# Patient Record
Sex: Female | Born: 1975 | Race: White | Hispanic: No | Smoking: Never smoker
Health system: Southern US, Community
[De-identification: ages and names within clinical notes are randomized; demographics above are authoritative.]

## PROBLEM LIST (undated history)

## (undated) DIAGNOSIS — T7840XA Allergy, unspecified, initial encounter: Secondary | ICD-10-CM

## (undated) DIAGNOSIS — F329 Major depressive disorder, single episode, unspecified: Secondary | ICD-10-CM

## (undated) DIAGNOSIS — F419 Anxiety disorder, unspecified: Secondary | ICD-10-CM

## (undated) DIAGNOSIS — E039 Hypothyroidism, unspecified: Secondary | ICD-10-CM

## (undated) DIAGNOSIS — E079 Disorder of thyroid, unspecified: Secondary | ICD-10-CM

## (undated) DIAGNOSIS — J45909 Unspecified asthma, uncomplicated: Secondary | ICD-10-CM

## (undated) DIAGNOSIS — G4730 Sleep apnea, unspecified: Secondary | ICD-10-CM

## (undated) DIAGNOSIS — I499 Cardiac arrhythmia, unspecified: Secondary | ICD-10-CM

## (undated) DIAGNOSIS — K219 Gastro-esophageal reflux disease without esophagitis: Secondary | ICD-10-CM

## (undated) DIAGNOSIS — F32A Depression, unspecified: Secondary | ICD-10-CM

## (undated) HISTORY — DX: Disorder of thyroid, unspecified: E07.9

## (undated) HISTORY — DX: Anxiety disorder, unspecified: F41.9

## (undated) HISTORY — DX: Depression, unspecified: F32.A

## (undated) HISTORY — DX: Unspecified asthma, uncomplicated: J45.909

## (undated) HISTORY — DX: Allergy, unspecified, initial encounter: T78.40XA

## (undated) HISTORY — DX: Major depressive disorder, single episode, unspecified: F32.9

## (undated) MED FILL — Ferumoxytol Inj 510 MG/17ML (30 MG/ML) (Elemental Fe): INTRAVENOUS | Qty: 17 | Status: AC

---

## 2007-08-30 DIAGNOSIS — I499 Cardiac arrhythmia, unspecified: Secondary | ICD-10-CM

## 2007-08-30 HISTORY — DX: Cardiac arrhythmia, unspecified: I49.9

## 2007-08-30 HISTORY — PX: CHOLECYSTECTOMY: SHX55

## 2016-01-13 ENCOUNTER — Emergency Department
Admission: EM | Admit: 2016-01-13 | Discharge: 2016-01-13 | Disposition: A | Discharge status: Home / Self Care | Payer: BLUE CROSS/BLUE SHIELD | Attending: Emergency Medicine | Admitting: Emergency Medicine

## 2016-01-13 ENCOUNTER — Encounter: Payer: Self-pay | Admitting: Emergency Medicine

## 2016-01-13 ENCOUNTER — Emergency Department: Payer: BLUE CROSS/BLUE SHIELD

## 2016-01-13 DIAGNOSIS — R079 Chest pain, unspecified: Secondary | ICD-10-CM | POA: Insufficient documentation

## 2016-01-13 DIAGNOSIS — R0602 Shortness of breath: Secondary | ICD-10-CM | POA: Diagnosis present

## 2016-01-13 DIAGNOSIS — J40 Bronchitis, not specified as acute or chronic: Secondary | ICD-10-CM | POA: Insufficient documentation

## 2016-01-13 HISTORY — DX: Gastro-esophageal reflux disease without esophagitis: K21.9

## 2016-01-13 HISTORY — DX: Cardiac arrhythmia, unspecified: I49.9

## 2016-01-13 LAB — CBC
HCT: 35.6 % (ref 35.0–47.0)
Hemoglobin: 12.1 g/dL (ref 12.0–16.0)
MCH: 27.7 pg (ref 26.0–34.0)
MCHC: 33.8 g/dL (ref 32.0–36.0)
MCV: 81.8 fL (ref 80.0–100.0)
PLATELETS: 365 10*3/uL (ref 150–440)
RBC: 4.36 MIL/uL (ref 3.80–5.20)
RDW: 14.2 % (ref 11.5–14.5)
WBC: 6.2 10*3/uL (ref 3.6–11.0)

## 2016-01-13 LAB — BASIC METABOLIC PANEL
Anion gap: 7 (ref 5–15)
BUN: 7 mg/dL (ref 6–20)
CHLORIDE: 106 mmol/L (ref 101–111)
CO2: 25 mmol/L (ref 22–32)
CREATININE: 0.96 mg/dL (ref 0.44–1.00)
Calcium: 8.2 mg/dL — ABNORMAL LOW (ref 8.9–10.3)
GFR calc Af Amer: >60 mL/min (ref >60)
GFR calc non Af Amer: >60 mL/min (ref >60)
Glucose, Bld: 83 mg/dL (ref 65–99)
Potassium: 3.2 mmol/L — ABNORMAL LOW (ref 3.5–5.1)
SODIUM: 138 mmol/L (ref 135–145)

## 2016-01-13 LAB — TROPONIN I: Troponin I: <0.03 ng/mL (ref <0.031)

## 2016-01-13 MED ORDER — DEXTROSE 5 % IV SOLN
500.0000 mg | Freq: Once | INTRAVENOUS | Status: DC
  Filled 2016-01-13: qty 500

## 2016-01-13 MED ORDER — AZITHROMYCIN 250 MG PO TABS
ORAL_TABLET | ORAL | Status: AC
  Administered 2016-01-13: 500 mg via ORAL
  Filled 2016-01-13: qty 2

## 2016-01-13 MED ORDER — PREDNISONE 20 MG PO TABS
60.0000 mg | ORAL_TABLET | Freq: Once | ORAL | Status: AC
  Administered 2016-01-13: 60 mg via ORAL
  Filled 2016-01-13: qty 3

## 2016-01-13 MED ORDER — BENZONATATE 100 MG PO CAPS: 100.0000 mg | ORAL_CAPSULE | Freq: Three times a day (TID) | ORAL | Status: DC | PRN

## 2016-01-13 MED ORDER — AZITHROMYCIN 250 MG PO TABS
500.0000 mg | ORAL_TABLET | Freq: Once | ORAL | Status: AC
  Administered 2016-01-13: 500 mg via ORAL

## 2016-01-13 MED ORDER — AZITHROMYCIN 250 MG PO TABS
ORAL_TABLET | ORAL | Status: DC
Start: 2016-01-13 — End: 2016-12-02

## 2016-01-13 MED ORDER — PREDNISONE 20 MG PO TABS: 60.0000 mg | ORAL_TABLET | Freq: Every day | ORAL | Status: DC

## 2016-01-13 NOTE — ED Provider Notes (Signed)
[facility] Emergency Department Provider Note  ____________________________________________  Time seen: Approximately 9:05 PM  I have reviewed the triage vital signs and the nursing notes.   HISTORY  Chief Complaint Chest Pain and Shortness of Breath    HPI Linda Bell is a 40 y.o. female , nonsmoker, presenting with cough and shortness of breath. The patient reports that for the past several days she has had a nonproductive cough associated with congestion but no rhinorrhea. No ear pain or sore throat. When she coughed she occasionally has chest pain. No known sick contacts. Has tried her albuterol inhaler which has not seemed to help. No lower extremity swelling or calf pain.   Past Medical History  Diagnosis Date  . Arrhythmia   . GERD (gastroesophageal reflux disease)     There are no active problems to display for this patient.   Past Surgical History  Procedure Laterality Date  . Cholecystectomy      Current Outpatient Rx  Name  Route  Sig  Dispense  Refill  . azithromycin (ZITHROMAX) 250 MG tablet      1 tab daily by mouth for four days.   4 each   0   . benzonatate (TESSALON PERLES) 100 MG capsule   Oral   Take 1 capsule (100 mg total) by mouth 3 (three) times daily as needed for cough.   15 capsule   0   . predniSONE (DELTASONE) 20 MG tablet   Oral   Take 3 tablets (60 mg total) by mouth daily.   4 tablet   0     Allergies Review of patient's allergies indicates no known allergies.  No family history on file.  Social History Social History  Substance Use Topics  . Smoking status: Never Smoker   . Smokeless tobacco: None  . Alcohol Use: No    Review of Systems Constitutional: No fever/chills.No lightheadedness or syncope. Eyes: No visual changes. No eye discharge. ENT: No sore throat. Positive congestion without rhinorrhea. Cardiovascular: Positive chest pain. Denies palpitations. Respiratory: Positive  shortness of breath.  Has a nonproductive cough. Gastrointestinal: No abdominal pain.  No nausea, no vomiting.  No diarrhea.  No constipation. Genitourinary: Negative for dysuria. Musculoskeletal: Negative for back pain. Skin: Negative for rash. Neurological: Negative for headaches. No focal numbness, tingling or weakness.   10-point ROS otherwise negative.  ____________________________________________   PHYSICAL EXAM:  VITAL SIGNS: ED Triage Vitals  Enc Vitals Group     BP 01/13/16 1823 112/78 mmHg     Pulse Rate 01/13/16 1823 75     Resp 01/13/16 1823 20     Temp 01/13/16 1823 97.5 F (36.4 C)     Temp Source 01/13/16 1823 Oral     SpO2 01/13/16 1823 100 %     Weight 01/13/16 1823 330 lb (149.687 kg)     Height 01/13/16 1823  (1.727 m)     Head Cir --      Peak Flow --      Pain Score 01/13/16 1824 6     Pain Loc --      Pain Edu? --      Excl. in GC? --     Constitutional: Alert and oriented. Well appearing and in no acute distress. Answers questions appropriately.Intermittently has a dry cough. Eyes: Conjunctivae are normal.  EOMI. No scleral icterus. Head: Atraumatic. Nose: No congestion/rhinnorhea. Mouth/Throat: Mucous membranes are moist.  Neck: No stridor.  Supple.   Cardiovascular: Normal rate, regular  rhythm. No murmurs, rubs or gallops.  Respiratory: Normal respiratory effort.  No accessory muscle use or retractions. Lungs CTAB.  No wheezes, rales or ronchi. Gastrointestinal: Soft, nontender and nondistended.  No guarding or rebound.  No peritoneal signs. Musculoskeletal: No LE edema. No ttp in the calves or palpable cords.  Negative Homan's sign. Neurologic:  A&Ox3.  Speech is clear.  Face and smile are symmetric.  EOMI.  Moves all extremities well. Skin:  Skin is warm, dry and intact. No rash noted. Psychiatric: Mood and affect are normal. Speech and behavior are normal.  Normal judgement.  ____________________________________________   LABS (all  labs ordered are listed, but only abnormal results are displayed)  Labs Reviewed  BASIC METABOLIC PANEL - Abnormal; Notable for the following:    Potassium 3.2 (*)    Calcium 8.2 (*)    All other components within normal limits  CBC  TROPONIN I   ____________________________________________  EKG  ED ECG REPORT I, Rockne MenghiniNorman, Anne-Caroline, the attending physician, personally viewed and interpreted this ECG.   Date: 01/13/2016  EKG Time: 1809  Rate: 70  Rhythm: normal sinus rhythm  Axis: Leftward  Intervals:Borderline prolonged QTC  ST&T Change: No ST elevation.  ____________________________________________  RADIOLOGY  Dg Chest 2 View  01/13/2016  CLINICAL DATA:  Chest pain with shortness of breath and cough for a couple days. Patient denies fever, nausea and vomiting. EXAM: CHEST  2 VIEW COMPARISON:  None. FINDINGS: The heart size and mediastinal contours are normal. There is mild central airway thickening without edema, confluent airspace opacity, pleural effusion or pneumothorax. The bones appear unremarkable. IMPRESSION: Central airway thickening suggesting bronchitis. No evidence of pneumonia or edema. Electronically Signed   By: Carey BullocksWilliam  Veazey M.D.   On: 01/13/2016 18:49    ____________________________________________   PROCEDURES  Procedure(s) performed: None  Critical Care performed: No ____________________________________________   INITIAL IMPRESSION / ASSESSMENT AND PLAN / ED COURSE  Pertinent labs & imaging results that were available during my care of the patient were reviewed by me and considered in my medical decision making (see chart for details).  40 y.o. female who is nonsmoker presenting with several days of nonproductive cough, who is a chest x-ray which is consistent with bronchitis. I do not think this patient is a primary cardiac cause of her cough, nor do I see any evidence of pneumonia, arrhythmia or ischemic changes, or PE today. We'll plan to  treat her for bronchitis, and have her follow-up with her primary care physician. She understands return precautions as well as follow-up instructions.  ____________________________________________  FINAL CLINICAL IMPRESSION(S) / ED DIAGNOSES  Final diagnoses:  Bronchitis      NEW MEDICATIONS STARTED DURING THIS VISIT:  New Prescriptions   AZITHROMYCIN (ZITHROMAX) 250 MG TABLET    1 tab daily by mouth for four days.   BENZONATATE (TESSALON PERLES) 100 MG CAPSULE    Take 1 capsule (100 mg total) by mouth 3 (three) times daily as needed for cough.   PREDNISONE (DELTASONE) 20 MG TABLET    Take 3 tablets (60 mg total) by mouth daily.     Rockne MenghiniAnne-Caroline Vonda Harth, MD 01/13/16 2113

## 2016-01-13 NOTE — Discharge Instructions (Signed)
Please return to the emergency department if you develop shortness of breath, lightheadedness or fainting, fever, or any other symptoms concerning to you.

## 2016-01-13 NOTE — ED Notes (Signed)
Pt presents to ED with reports of chest pain, shortness of breath and cough for a couple of days. Pt states pain worsened today. Pt denies fever. Pt denies nausea, vomiting and diarrhea. Pt reports dizziness and lightheadedness. Pt speaking in complete sentences without difficulty.

## 2016-01-18 MED ORDER — LIDOCAINE HCL (PF) 1 % IJ SOLN
INTRAMUSCULAR | Status: AC
  Filled 2016-01-18: qty 5

## 2016-12-02 ENCOUNTER — Encounter: Payer: Self-pay | Admitting: Nurse Practitioner

## 2016-12-02 ENCOUNTER — Other Ambulatory Visit: Payer: Self-pay | Admitting: *Deleted

## 2016-12-02 ENCOUNTER — Ambulatory Visit (INDEPENDENT_AMBULATORY_CARE_PROVIDER_SITE_OTHER): Payer: BLUE CROSS/BLUE SHIELD | Admitting: Nurse Practitioner

## 2016-12-02 VITALS — BP 114/72 | HR 62 | Temp 97.5°F | Resp 16 | Ht 68.0 in | Wt 349.0 lb

## 2016-12-02 DIAGNOSIS — I499 Cardiac arrhythmia, unspecified: Secondary | ICD-10-CM | POA: Insufficient documentation

## 2016-12-02 DIAGNOSIS — E039 Hypothyroidism, unspecified: Secondary | ICD-10-CM | POA: Insufficient documentation

## 2016-12-02 DIAGNOSIS — F32A Depression, unspecified: Secondary | ICD-10-CM | POA: Insufficient documentation

## 2016-12-02 DIAGNOSIS — Z6841 Body Mass Index (BMI) 40.0 and over, adult: Secondary | ICD-10-CM | POA: Insufficient documentation

## 2016-12-02 DIAGNOSIS — E038 Other specified hypothyroidism: Secondary | ICD-10-CM

## 2016-12-02 DIAGNOSIS — Z7689 Persons encountering health services in other specified circumstances: Secondary | ICD-10-CM

## 2016-12-02 DIAGNOSIS — K589 Irritable bowel syndrome without diarrhea: Secondary | ICD-10-CM | POA: Insufficient documentation

## 2016-12-02 DIAGNOSIS — F329 Major depressive disorder, single episode, unspecified: Secondary | ICD-10-CM | POA: Insufficient documentation

## 2016-12-02 DIAGNOSIS — N926 Irregular menstruation, unspecified: Secondary | ICD-10-CM | POA: Diagnosis not present

## 2016-12-02 DIAGNOSIS — K582 Mixed irritable bowel syndrome: Secondary | ICD-10-CM

## 2016-12-02 DIAGNOSIS — J302 Other seasonal allergic rhinitis: Secondary | ICD-10-CM | POA: Insufficient documentation

## 2016-12-02 DIAGNOSIS — F419 Anxiety disorder, unspecified: Secondary | ICD-10-CM | POA: Insufficient documentation

## 2016-12-02 HISTORY — DX: Irritable bowel syndrome, unspecified: K58.9

## 2016-12-02 LAB — CBC WITH DIFFERENTIAL/PLATELET
Basophils Absolute: 0 cells/uL (ref 0–200)
Basophils Relative: 0 %
Eosinophils Absolute: 504 cells/uL — ABNORMAL HIGH (ref 15–500)
Eosinophils Relative: 6 %
HCT: 37.9 % (ref 35.0–45.0)
Hemoglobin: 12.2 g/dL (ref 11.7–15.5)
Lymphocytes Relative: 29 %
Lymphs Abs: 2436 cells/uL (ref 850–3900)
MCH: 27.5 pg (ref 27.0–33.0)
MCHC: 32.2 g/dL (ref 32.0–36.0)
MCV: 85.4 fL (ref 80.0–100.0)
MPV: 10 fL (ref 7.5–12.5)
Monocytes Absolute: 504 cells/uL (ref 200–950)
Monocytes Relative: 6 %
Neutro Abs: 4956 cells/uL (ref 1500–7800)
Neutrophils Relative %: 59 %
Platelets: 437 10*3/uL — ABNORMAL HIGH (ref 140–400)
RBC: 4.44 MIL/uL (ref 3.80–5.10)
RDW: 14.3 % (ref 11.0–15.0)
WBC: 8.4 10*3/uL (ref 3.8–10.8)

## 2016-12-02 LAB — TSH: TSH: 8.09 mIU/L — ABNORMAL HIGH

## 2016-12-02 NOTE — Patient Instructions (Addendum)
Linda Bell, Thank you for coming in to clinic today.  1. Weight Management: Keep a food log for the next 4-6 weeks.  Write down the meal time, what you are eating, and how much.  Include all snacks and drinks.  2. Thyroid:  We will check your TSH and we will consider thyroid hormone replacement after labs.  3. Irregular Menses:   Track your bleeding.  We will check CBC and hormone level of testosterone to check for PCOS.  Your vaginal exam today was normal.  We will see what your lab work shows and move forward from there.  We may need to refer you to OBGYN for further evaluation.   Please schedule a follow-up appointment with Wilhelmina Mcardle, AGNP in  1 months.  If you have any other questions or concerns, please feel free to call the clinic or send a message through MyChart. You may also schedule an earlier appointment if necessary.  Wilhelmina Mcardle, DNP, AGNP-BC Adult Gerontology Nurse Practitioner [facility] [place]   Polycystic Ovarian Syndrome Polycystic ovarian syndrome (PCOS) is a common hormonal disorder among women of reproductive age. In most women with PCOS, many small fluid-filled sacs (cysts) grow on the ovaries, and the cysts are not part of a normal menstrual cycle. PCOS can cause problems with your menstrual periods and make it difficult to get pregnant. It can also cause an increased risk of miscarriage with pregnancy. If it is not treated, PCOS can lead to serious health problems, such as diabetes and heart disease. What are the causes? The cause of PCOS is not known, but it may be the result of a combination of certain factors, such as:  Irregular menstrual cycle.  High levels of certain hormones (androgens).  Problems with the hormone that helps to control blood sugar (insulin resistance).  Certain genes. What increases the risk? This condition is more likely to develop in women who have a family history of PCOS. What are the signs or  symptoms? Symptoms of PCOS may include:  Multiple ovarian cysts.  Infrequent periods or no periods.  Periods that are too frequent or too heavy.  Unpredictable periods.  Inability to get pregnant (infertility) because of not ovulating.  Increased growth of hair on the face, chest, stomach, back, thumbs, thighs, or toes.  Acne or oily skin. Acne may develop during adulthood, and it may not respond to treatment.  Pelvic pain.  Weight gain or obesity.  Patches of thickened and dark brown or black skin on the neck, arms, breasts, or thighs (acanthosis nigricans).  Excess hair growth on the face, chest, abdomen, or upper thighs (hirsutism). How is this diagnosed? This condition is diagnosed based on:  Your medical history.  A physical exam, including a pelvic exam. Your health care provider may look for areas of increased hair growth on your skin.  Tests, such as:  Ultrasound. This may be used to examine the ovaries and the lining of the uterus (endometrium) for cysts.  Blood tests. These may be used to check levels of sugar (glucose), female hormone (testosterone), and female hormones (estrogen and progesterone) in your blood. How is this treated? There is no cure for PCOS, but treatment can help to manage symptoms and prevent more health problems from developing. Treatment varies depending on:  Your symptoms.  Whether you want to have a baby or whether you need birth control (contraception). Treatment may include nutrition and lifestyle changes along with:  Progesterone hormone to start a menstrual period.  Birth control pills to help you have regular menstrual periods.  Medicines to make you ovulate, if you want to get pregnant.  Medicine to reduce excessive hair growth.  Surgery, in severe cases. This may involve making small holes in one or both of your ovaries. This decreases the amount of testosterone that your body produces. Follow these instructions at  home:  Take over-the-counter and prescription medicines only as told by your health care provider.  Follow a healthy meal plan. This can help you reduce the effects of PCOS.  Eat a healthy diet that includes lean proteins, complex carbohydrates, fresh fruits and vegetables, low-fat dairy products, and healthy fats. Make sure to eat enough fiber.  If you are overweight, lose weight as told by your health care provider.  Losing 10% of your body weight may improve symptoms.  Your health care provider can determine how much weight loss is best for you and can help you lose weight safely.  Keep all follow-up visits as told by your health care provider. This is important. Contact a health care provider if:  Your symptoms do not get better with medicine.  You develop new symptoms. This information is not intended to replace advice given to you by your health care provider. Make sure you discuss any questions you have with your health care provider. Document Released: 12/09/2004 Document Revised: 04/12/2016 Document Reviewed: 01/31/2016 Elsevier Interactive Patient Education  2017 [employer].

## 2016-12-02 NOTE — Progress Notes (Signed)
Subjective:    Patient ID: Linda Bell, female    DOB: 1975/12/14, 41 y.o.   MRN: [id number]  Linda Bell is a 41 y.o. female presenting on 12/02/2016 for Establish Care   HPI   Last PCP [facility] in [place].  Taneasha states she needs a complete physical because her last one was about 2 years ago.  Her last PAP was November 2016 with normal cells.  She is unsure of HPV testing with the PAP.  Irregular menstrual bleeding Patient notes bleeding off and on for last 3 weeks with mostly spotting.  She was bleeding enough to have clots 2 days ago then spotting the next.  She thinks her last true period started 2 weeks ago.  She notes irregular menses for last 20 years.  After last pregnancy, she used depo x 1 year and hasn't had regular periods since she got pregnant.  Her last OBGYN visit was around the time of her last pregnancy.  Hypothyroidism Thyroid low in past.  Previously on 25 mcg Synthroid, but has not been taking for 2.5 years and she wants a TSH to see if she needs to start taking it again.  She has been having fatigue, and some weight gain (a few pounds in a year). No recent changes in skin, hair, nails, but notes her nails are always brittle and she has clumps of hair falling out.  She has a heart arrhythmia, but no palpitations recently on medication.  Denies sleep disruption, night sweats, leg swelling, and temperature intolerance.  IBS 19 years ago IBS - mostly requires straining and time for BM. Not constipation.  1 week: 3/7 days.  And extremes.  Not taking anything for this.  Used metamucil in beginning and didn't notice a difference.  Symptoms greatly improved from initial diagnosis and notes that it is manageable now.  BMI 48.6 At the largest weight.  Last 9 years has been around 340 - 350 lbs.  She is able to lose a few lbs, but regains.  She has not had any significant increases in weight over just a few months in her past.  She was overweight  as a child.    Currently she is only eating one meal per day.  She eats supper around 5-6 pm and has a nighttime snack at 10-11 pm with a few chips.  A regular size chip bag lasts about 2 weeks.  She notes her husband has recently lost a significant amount of weight intentionally and she is preparing the same foods for him at home as for herself.  She has no regular physical activity because she has no energy.  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  Past Medical History:  Diagnosis Date  . Allergy   . Anxiety   . Arrhythmia   . Depression   . GERD (gastroesophageal reflux disease)   . Thyroid disease    hypo   Past Surgical History:  Procedure Laterality Date  . CHOLECYSTECTOMY  2009   Social History   Social History  . Marital status: Married    Spouse name: N/A  . Number of children: N/A  . Years of education: N/A   Occupational History  . Not on file.   Social History Main Topics  . Smoking status: Never Smoker  . Smokeless tobacco: Never Used     Comment: second hand smoke exposure in home as child,  and adult none last 15 years  . Alcohol  use No  . Drug use: No  . Sexual activity: Yes    Birth control/ protection: None   Other Topics Concern  . Not on file   Social History Narrative  . No narrative on file   Family History  Problem Relation Age of Onset  . Cancer Mother     basal cell (> 1 occurence)  . Depression Mother   . Cancer Father     renal  . Drug abuse Father   . Mental illness Father   . Depression Father   . Mental illness Brother     substance abuse  . Heart attack Maternal Grandmother   . Prostate cancer Maternal Grandfather   . Depression Maternal Grandfather   . Mental illness Maternal Grandfather   . Depression Paternal Grandmother   . Heart attack Paternal Grandfather    No current outpatient prescriptions on file prior to visit.   No current facility-administered medications on file prior to  visit.     Review of Systems  Constitutional: Positive for fatigue. Negative for activity change, appetite change, chills and unexpected weight change.  HENT: Negative.   Eyes: Negative.   Respiratory: Negative.   Gastrointestinal: Positive for constipation and diarrhea. Negative for abdominal distention, abdominal pain, nausea and vomiting.  Endocrine: Negative.   Genitourinary: Positive for menstrual problem and vaginal bleeding.  Musculoskeletal: Positive for back pain and neck pain.  Skin: Negative.   Allergic/Immunologic: Negative.   Neurological: Negative.   Hematological: Negative.   Psychiatric/Behavioral: Negative.    Per HPI unless specifically indicated above  Depression screen [facility] 2/9 12/02/2016  Decreased Interest 0  Down, Depressed, Hopeless 0  PHQ - 2 Score 0       Objective:    BP 114/72 (BP Location: Right Arm, Patient Position: Sitting, Cuff Size: Large)   Pulse 62   Temp 97.5 F (36.4 C) (Oral)   Resp 16   Ht  (1.727 m)   Wt (!) 349 lb (158.3 kg)   BMI 53.07 kg/m   Wt Readings from Last 3 Encounters:  12/02/16 (!) 349 lb (158.3 kg)  01/13/16 (!) 330 lb (149.7 kg)    Physical Exam  Constitutional: She appears well-developed. No distress.  Morbidly obese  HENT:  Head: Normocephalic and atraumatic.  Right Ear: External ear normal.  Left Ear: External ear normal.  Mouth/Throat: Oropharynx is clear and moist.  Upper dentures  Eyes: Conjunctivae and EOM are normal. Pupils are equal, round, and reactive to light.  Neck: Normal range of motion. Neck supple. No JVD present. No tracheal deviation present. No thyromegaly present.  Cardiovascular: Normal rate, regular rhythm, normal heart sounds and intact distal pulses.   Pulmonary/Chest: Effort normal and breath sounds normal.  Abdominal: Soft. Bowel sounds are normal.  Negative hepatosplenomegaly with scratch test  Genitourinary: Vagina normal and uterus normal. No vaginal discharge found.    Genitourinary Comments: Difficult bimanual exam r/t body habitus  Lymphadenopathy:    She has no cervical adenopathy.  Neurological: She is alert.  Skin: Skin is warm and dry.  Psychiatric: Her behavior is normal.  Normal mood, flat affect   Results for orders placed or performed during the hospital encounter of 01/13/16  Basic metabolic panel  Result Value Ref Range   Sodium 138 135 - 145 mmol/L   Potassium 3.2 (L) 3.5 - 5.1 mmol/L   Chloride 106 101 - 111 mmol/L   CO2 25 22 - 32 mmol/L   Glucose, Bld 83 65 -  99 mg/dL   BUN 7 6 - 20 mg/dL   Creatinine, Ser 0.96 0.44 - 1.00 mg/dL9.14  Calcium 8.2 (L) 8.9 - 10.3 mg/dL   GFR calc non Af Amer >60 >60 mL/min   GFR calc Af Amer >60 >60 mL/min   Anion gap 7 5 - 15  CBC  Result Value Ref Range   WBC 6.2 3.6 - 11.0 K/uL   RBC 4.36 3.80 - 5.20 MIL/uL   Hemoglobin 12.1 12.0 - 16.0 g/dL   HCT 78.2 95.6 - 21.3 %   MCV 81.8 80.0 - 100.0 fL   MCH 27.7 26.0 - 34.0 pg   MCHC 33.8 32.0 - 36.0 g/dL   RDW 08.6 57.8 - 46.9 %   Platelets 365 150 - 440 K/uL  Troponin I  Result Value Ref Range   Troponin I <0.03 <0.031 ng/mL      Assessment & Plan:   Problem List Items Addressed This Visit      Digestive   IBS (irritable bowel syndrome) 1. Discussed adding metamucil or psyllium fiber back into regular use for encouragement of bowel consistency.   2. Patient experience with symptoms not significant for additional treatment at this time.   Relevant Medications   omeprazole (PRILOSEC) 40 MG capsule   famotidine (PEPCID) 40 MG tablet     Endocrine   Hypothyroid 1. History of previously treated hypothyroidism without active treatment. 2. Patient willing to resume treatment if indicated by labs. 3. Collect TSH for evaluation.   Relevant Orders   TSH     Other   BMI 45.0-49.9, adult (HCC) 1. General dietary and activity survey completed. Requested pt keep a food diary for review at next visit. 2. Educated patient that there may be  medically treatable conditions that contribute to obesity. 3. Screen for PCOS with Serum Total and free testosterone in presence of obesity and history of irregular menses.  4. Information about PCOS provided to patient for review.  Ensured patient she does not yet have a PCOS diagnosis, but that the information will be helpful for review and includes some tips for healthy lifestyle.    Other Visit Diagnoses    Establishing care with new doctor, encounter for    -  Primary   Irregular menses     1. Anatomical exam normal.  However, was difficult to perform secondary to body habitus.  Consider pelvic US and/or referral to OBGYN after results of labs received. 2. Rule out pregnancy.  Pt not currently preventing pregnancy. 3. Discussed options of OCP to regulate menses.  Patient not willing to consider this option at this time. 4. Obtain CBC to evaluate possible anemia in presence of long-term bleeding. 5. Screen PCOS with serum total and free testosterone. 6. Obtain TSH. Assess for possible contributing factors.   Relevant Orders   CBC with Differential TSH   Testosterone, free and Total   B-HCG Quant      Meds ordered this encounter  Medications  . omeprazole (PRILOSEC) 40 MG capsule    Sig: Take 40 mg by mouth daily.  . famotidine (PEPCID) 40 MG tablet    Sig: Take 40 mg by mouth daily.    Request records from former PCP at [facility] in [place].  Follow up plan: Return in about 1 month (around 01/01/2017) for annual physical.  Wilhelmina Mcardle, DNP, AGPCNP-BC Adult Gerontology Primary Care Nurse Practitioner [facility] 12/02/2016, 12:34 PM

## 2016-12-02 NOTE — Assessment & Plan Note (Signed)
1. Keep a food log for the next 4-6 weeks .   2. Write down the meal time, what you are eating, and how much.  Include all snacks and drinks.

## 2016-12-03 DIAGNOSIS — N926 Irregular menstruation, unspecified: Secondary | ICD-10-CM | POA: Insufficient documentation

## 2016-12-03 LAB — HCG, QUANTITATIVE, PREGNANCY: hCG, Beta Chain, Quant, S: <2 m[IU]/mL

## 2016-12-04 LAB — TESTOS,TOTAL,FREE AND SHBG (FEMALE)
Sex Hormone Binding Glob.: 33 nmol/L (ref 17–124)
Testosterone, Free: 3 pg/mL (ref 0.1–6.4)
Testosterone,Total,LC/MS/MS: 26 ng/dL (ref 2–45)

## 2016-12-05 ENCOUNTER — Ambulatory Visit (INDEPENDENT_AMBULATORY_CARE_PROVIDER_SITE_OTHER): Payer: BLUE CROSS/BLUE SHIELD | Admitting: Nurse Practitioner

## 2016-12-05 ENCOUNTER — Encounter: Payer: Self-pay | Admitting: Nurse Practitioner

## 2016-12-05 VITALS — BP 137/82 | HR 66 | Temp 97.7°F | Resp 16 | Ht 68.0 in | Wt 347.0 lb

## 2016-12-05 DIAGNOSIS — M542 Cervicalgia: Secondary | ICD-10-CM | POA: Diagnosis not present

## 2016-12-05 DIAGNOSIS — E039 Hypothyroidism, unspecified: Secondary | ICD-10-CM

## 2016-12-05 MED ORDER — METHOCARBAMOL 750 MG PO TABS: 750.0000 mg | ORAL_TABLET | Freq: Four times a day (QID) | ORAL | 0 refills | Status: DC | PRN

## 2016-12-05 MED ORDER — LEVOTHYROXINE SODIUM 50 MCG PO TABS: 50.0000 ug | ORAL_TABLET | Freq: Every day | ORAL | 3 refills | Status: DC

## 2016-12-05 NOTE — Progress Notes (Signed)
I have reviewed this encounter including the documentation in this note and/or discussed this patient with the provider, Wilhelmina Mcardle, AGPCNP-BC. I am certifying that I agree with the content of this note as supervising physician.  Saralyn Pilar, DO [facility] 12/05/2016, 9:22 AM

## 2016-12-05 NOTE — Progress Notes (Signed)
Subjective:    Patient ID: Linda Bell, female    DOB: 1976-06-26, 41 y.o.   MRN: [id number]  Linda Bell is a 41 y.o. female presenting on 12/05/2016 for Neck Pain (onset couple of months getting worst )   HPI  Back Pain Low back pain when sit and stand back hurts and takes a while to subside.  This is tolerable and resolves once moving.  Neck Pain Patient has neck pain on left side that extends around her shoulders to right side.  Something is "digging in" to the left side and tensing muscles.  The only time it doesn't hurt is when she doesn't move.  It hurts worse when she drives.  Onset of pain was months ago and started when she was a passenger in the car, now it hurts with any activty.  Pain daily with movement.    She took one flexeril once and it helped along with ibuprofen 600 mg.  She denies burning and shooting pain.  Pain does not radiate.   Pain effect on daily life: Doing things around house is difficult (washing dishes/cleaning kitchen while standing and bent over is most difficult.  It is not impossible, but after 10-15 minutes she needs a break.     Hypothyroidism No new symptoms since visit on 4/6.  Reviewed labs from 4/6 and indicates hypothyroidism.  Social History  Substance Use Topics  . Smoking status: Never Smoker  . Smokeless tobacco: Never Used     Comment: second hand smoke exposure in home as child,  and adult none last 15 years  . Alcohol use No    Review of Systems Per HPI unless specifically indicated above     Objective:    BP 137/82 (BP Location: Left Arm, Patient Position: Sitting, Cuff Size: Large)   Pulse 66   Temp 97.7 F (36.5 C) (Oral)   Resp 16   Ht 5' 8"  (1.727 m)   Wt (!) 347 lb (157.4 kg)   BMI 52.76 kg/m   Wt Readings from Last 3 Encounters:  12/05/16 (!) 347 lb (157.4 kg)  12/02/16 (!) 349 lb (158.3 kg)  01/13/16 (!) 330 lb (149.7 kg)    Physical Exam  Constitutional: She is oriented to person, place, and  time. She appears well-developed and well-nourished. No distress.  HENT:  Head: Normocephalic and atraumatic.  Neck: Neck supple. Muscular tenderness present. No spinous process tenderness present. No neck rigidity. Decreased range of motion present. No edema and no erythema present.  Decreased lateral ROM when bending neck toward L and R shoulders.  Passive ROM limited by pain.  Muscular tenderness over L shoulder and trapezius.  No muscular tenderness over cervical spine.  Cardiovascular: Normal rate, regular rhythm, normal heart sounds and intact distal pulses.   Neurological: She is alert and oriented to person, place, and time.  Skin: Skin is warm and dry.  Psychiatric: She has a normal mood and affect. Her behavior is normal.   Results for orders placed or performed in visit on 12/02/16  TSH  Result Value Ref Range   TSH 8.09 (H) mIU/L  CBC with Differential  Result Value Ref Range   WBC 8.4 3.8 - 10.8 K/uL   RBC 4.44 3.80 - 5.10 MIL/uL   Hemoglobin 12.2 11.7 - 15.5 g/dL   HCT 37.9 35.0 - 45.0 %   MCV 85.4 80.0 - 100.0 fL   MCH 27.5 27.0 - 33.0 pg   MCHC 32.2 32.0 - 36.0 g/dL  RDW 14.3 11.0 - 15.0 %   Platelets 437 (H) 140 - 400 K/uL   MPV 10.0 7.5 - 12.5 fL   Neutro Abs 4,956 1,500 - 7,800 cells/uL   Lymphs Abs 2,436 850 - 3,900 cells/uL   Monocytes Absolute 504 200 - 950 cells/uL   Eosinophils Absolute 504 (H) 15 - 500 cells/uL   Basophils Absolute 0 0 - 200 cells/uL   Neutrophils Relative % 59 %   Lymphocytes Relative 29 %   Monocytes Relative 6 %   Eosinophils Relative 6 %   Basophils Relative 0 %   Smear Review Criteria for review not met   hCG, quantitative, pregnancy  Result Value Ref Range   hCG, Beta Chain, Quant, S <2.0 mIU/mL  Testos,Total,Free and SHBG (Female)  Result Value Ref Range   Testosterone,Total,LC/MS/MS 26 2 - 45 ng/dL   Testosterone, Free 3.0 0.1 - 6.4 pg/mL   Sex Hormone Binding Glob. 33 17 - 124 nmol/L      Assessment & Plan:   Problem  List Items Addressed This Visit      Endocrine   Hypothyroid - Primary - START taking levothyroxine 50 mcg daily 1 hour before breakfast on an empty stomach.  Only drink water in the first hour. - Recheck TSH in 6 weeks as part of labs only visit.      Relevant Medications   levothyroxine (SYNTHROID, LEVOTHROID) 50 MCG tablet   Other Relevant Orders   TSH    Other Visit Diagnoses    Neck pain     - Referral and orders given for [facility] for eval and treat.  Recommended 2-3 times per week for 2-4 weeks of treatments with ongoing exercises at home. - Discussed that pain may increase as muscle tension is released.  Encouraged to keep working with PT/OT and at home exercises for long-term pain control. - Take Robaxin 1.5 grams four times per day as needed for 5 days. Do not use this if driving. - Start taking Tylenol extra strength 1 to 2 tablets every 6-8 hours for aches or fever/chills for next few days as needed.  Do not take more than 3,000 mg in 24 hours from all medicines.  May take Ibuprofen as well if tolerated 200-492m every 8 hours as needed. - Use heat and ice for pain.  Apply for 15 minutes at a time 6-8 times per day.   - Muscle rub with lidocaine.  Avoid using this with heat and ice to avoid burns.   Relevant Medications   methocarbamol (ROBAXIN-750) 750 MG tablet   Other Relevant Orders   Ambulatory referral to Physical Therapy      Meds ordered this encounter  Medications  . methocarbamol (ROBAXIN-750) 750 MG tablet    Sig: Take 1 tablet (750 mg total) by mouth 4 (four) times daily as needed for muscle spasms.    Dispense:  40 tablet    Refill:  0    Order Specific Question:   Supervising Provider    Answer:   KOlin Hauser[[id number]]  . levothyroxine (SYNTHROID, LEVOTHROID) 50 MCG tablet    Sig: Take 1 tablet (50 mcg total) by mouth daily.    Dispense:  90 tablet    Refill:  3    Order Specific Question:   Supervising Provider    Answer:    KOlin Hauser[[id number]]   Follow up plan: Return in about 3 months (around 03/06/2017) for 3 months for weight and depression;  labs only 6 weeks.  Cassell Smiles, DNP, AGPCNP-BC Adult Gerontology Primary Care Nurse Practitioner [facility] 12/05/2016, 10:37 PM

## 2016-12-05 NOTE — Patient Instructions (Addendum)
Linda Bell, Thank you for coming in to clinic today.  1. For your neck pain, our treatment will focus on resolving the issue causing your pain.  In this situation it is your muscle tension. - Please go to physical therapy for an evaluation and treatment.  I would recommend at least 2-3 weeks of treatments with ongoing work at home. - Take Robaxin 1.5 grams four times per day as needed. Do not use this if you need to drive, it can make you sleepy.  You have enough medication for 5 days.  - Start taking Tylenol extra strength 1 to 2 tablets every 6-8 hours for aches or fever/chills for next few days as needed.  Do not take more than 3,000 mg in 24 hours from all medicines.  May take Ibuprofen as well if tolerated 200-400mg  every 8 hours as needed. - Use heat and ice.  Apply this for 15 minutes at a time 6-8 times per day.   - Muscle rub with lidocaine.  Avoid using this with heat and ice to avoid burns.  2. For your thyroid. - START taking levothyroxine 50 mcg daily 1 hour before breakfast on an empty stomach.  Only drink water.      Please schedule a follow-up appointment with Wilhelmina Mcardle, AGNP in 3 months and 6 weeks for labs only for your thyroid.  If you have any other questions or concerns, please feel free to call the clinic or send a message through MyChart. You may also schedule an earlier appointment if necessary.  Wilhelmina Mcardle, DNP, AGNP-BC Adult Gerontology Nurse Practitioner [facility], [facility]   Levothyroxine tablets What is this medicine? LEVOTHYROXINE (lee voe thye ROX een) is a thyroid hormone. This medicine can improve symptoms of thyroid deficiency such as slow speech, lack of energy, weight gain, hair loss, dry skin, and feeling cold. It also helps to treat goiter (an enlarged thyroid gland). It is also used to treat some kinds of thyroid cancer along with surgery and other medicines. This medicine may be used for other purposes; ask your health care  provider or pharmacist if you have questions. COMMON BRAND NAME(S): Estre, Levo-T, Levothroid, Levoxyl, Synthroid, Thyro-Tabs, Unithroid What should I tell my health care provider before I take this medicine? They need to know if you have any of these conditions: -angina -blood clotting problems -diabetes -dieting or on a weight loss program -fertility problems -heart disease -high levels of thyroid hormone -pituitary gland problem -previous heart attack -an unusual or allergic reaction to levothyroxine, thyroid hormones, other medicines, foods, dyes, or preservatives -pregnant or trying to get pregnant -breast-feeding How should I use this medicine? Take this medicine by mouth with plenty of water. It is best to take on an empty stomach, at least 30 minutes before or 2 hours after food. Follow the directions on the prescription label. Take at the same time each day. Do not take your medicine more often than directed. Contact your pediatrician regarding the use of this medicine in children. While this drug may be prescribed for children and infants as young as a few days of age for selected conditions, precautions do apply. For infants, you may crush the tablet and place in a small amount of (5-10 ml or 1 to 2 teaspoonfuls) of water, breast milk, or non-soy based infant formula. Do not mix with soy-based infant formula. Give as directed. Overdosage: If you think you have taken too much of this medicine contact a poison control center or emergency  room at once. NOTE: This medicine is only for you. Do not share this medicine with others. What if I miss a dose? If you miss a dose, take it as soon as you can. If it is almost time for your next dose, take only that dose. Do not take double or extra doses. What may interact with this medicine? -amiodarone -antacids -anti-thyroid medicines -calcium supplements -carbamazepine -cholestyramine -colestipol -digoxin -female hormones, including  contraceptive or birth control pills -iron supplements -ketamine -liquid nutrition products like Ensure -medicines for colds and breathing difficulties -medicines for diabetes -medicines for mental depression -medicines or herbals used to decrease weight or appetite -phenobarbital or other barbiturate medications -phenytoin -prednisone or other corticosteroids -rifabutin -rifampin -soy isoflavones -sucralfate -theophylline -warfarin This list may not describe all possible interactions. Give your health care provider a list of all the medicines, herbs, non-prescription drugs, or dietary supplements you use. Also tell them if you smoke, drink alcohol, or use illegal drugs. Some items may interact with your medicine. What should I watch for while using this medicine? Be sure to take this medicine with plenty of fluids. Some tablets may cause choking, gagging, or difficulty swallowing from the tablet getting stuck in your throat. Most of these problems disappear if the medicine is taken with the right amount of water or other fluids. Do not switch brands of this medicine unless your health care professional agrees with the change. Ask questions if you are uncertain. You will need regular exams and occasional blood tests to check the response to treatment. If you are receiving this medicine for an underactive thyroid, it may be several weeks before you notice an improvement. Check with your doctor or health care professional if your symptoms do not improve. It may be necessary for you to take this medicine for the rest of your life. Do not stop using this medicine unless your doctor or health care professional advises you to. This medicine can affect blood sugar levels. If you have diabetes, check your blood sugar as directed. You may lose some of your hair when you first start treatment. With time, this usually corrects itself. If you are going to have surgery, tell your doctor or health care  professional that you are taking this medicine. What side effects may I notice from receiving this medicine? Side effects that you should report to your doctor or health care professional as soon as possible: -allergic reactions like skin rash, itching or hives, swelling of the face, lips, or tongue -chest pain -excessive sweating or intolerance to heat -fast or irregular heartbeat -nervousness -skin rash or hives -swelling of ankles, feet, or legs -tremors Side effects that usually do not require medical attention (report to your doctor or health care professional if they continue or are bothersome): -changes in appetite -changes in menstrual periods -diarrhea -hair loss -headache -trouble sleeping -weight loss This list may not describe all possible side effects. Call your doctor for medical advice about side effects. You may report side effects to FDA at 1-800-FDA-1088. Where should I keep my medicine? Keep out of the reach of children. Store at room temperature between 15 and 30 degrees C (59 and 86 degrees F). Protect from light and moisture. Keep container tightly closed. Throw away any unused medicine after the expiration date. NOTE: This sheet is a summary. It may not cover all possible information. If you have questions about this medicine, talk to your doctor, pharmacist, or health care provider.  2018 Elsevier/Gold Standard (2008-11-21 14:28:07)

## 2016-12-06 ENCOUNTER — Encounter: Payer: Self-pay | Admitting: Nurse Practitioner

## 2016-12-06 NOTE — Progress Notes (Signed)
I have reviewed this encounter including the documentation in this note and/or discussed this patient with the provider, Wilhelmina Mcardle, AGPCNP-BC. I am certifying that I agree with the content of this note as supervising physician.  Saralyn Pilar, DO [facility] 12/06/2016, 7:12 PM

## 2017-01-03 ENCOUNTER — Other Ambulatory Visit: Payer: Self-pay | Admitting: Nurse Practitioner

## 2017-01-03 NOTE — Telephone Encounter (Signed)
Last ov 12/05/16 Last filled 11/22/16

## 2017-01-16 ENCOUNTER — Other Ambulatory Visit: Payer: BLUE CROSS/BLUE SHIELD

## 2017-02-06 ENCOUNTER — Other Ambulatory Visit: Payer: Self-pay

## 2017-02-07 ENCOUNTER — Other Ambulatory Visit: Payer: Self-pay | Admitting: Nurse Practitioner

## 2017-02-07 ENCOUNTER — Other Ambulatory Visit: Payer: Self-pay

## 2017-02-07 MED ORDER — FLUOXETINE HCL 40 MG PO CAPS: 40.0000 mg | ORAL_CAPSULE | Freq: Every day | ORAL | 0 refills | Status: DC

## 2017-03-07 ENCOUNTER — Ambulatory Visit (INDEPENDENT_AMBULATORY_CARE_PROVIDER_SITE_OTHER): Payer: BLUE CROSS/BLUE SHIELD | Admitting: Nurse Practitioner

## 2017-03-07 ENCOUNTER — Encounter: Payer: Self-pay | Admitting: Nurse Practitioner

## 2017-03-07 VITALS — BP 114/70 | HR 61 | Temp 97.4°F | Ht 68.0 in | Wt 343.0 lb

## 2017-03-07 DIAGNOSIS — F331 Major depressive disorder, recurrent, moderate: Secondary | ICD-10-CM

## 2017-03-07 DIAGNOSIS — F4320 Adjustment disorder, unspecified: Secondary | ICD-10-CM | POA: Diagnosis not present

## 2017-03-07 DIAGNOSIS — F4321 Adjustment disorder with depressed mood: Secondary | ICD-10-CM

## 2017-03-07 MED ORDER — BUSPIRONE HCL 5 MG PO TABS: 5.0000 mg | ORAL_TABLET | Freq: Two times a day (BID) | ORAL | 2 refills | Status: DC

## 2017-03-07 NOTE — Progress Notes (Signed)
Subjective:    Patient ID: Linda Bell, female    DOB: 1976/07/18, 41 y.o.   MRN: [id number]  Linda Bell is a 41 y.o. female presenting on 03/07/2017 for Depression   HPI Depression Pt's husband passed away approx 4 weeks ago.  Fluoxetine 40 mg once daily -higher after last visit. Has helped moods w/ higher dose. PHQ-9 = 15 today  No motivation for household chores.  Went x 2 weeks w/o clean clothes.  Notices that she is constantly nervous.  Can't go to sleep at night and wants to sleep all day.  Some days will sleep all day and overnight. Normal day: can't sleep at night.  Once asleep, then sleeps all day. Also has inconsistent appetite (low/high).  Mother and son are living w/ pt which was living environment prior to husband's death.  Pt states she would like support services for grieving, but doesn't know where to find these.  Depression screen [facility] 2/9 03/07/2017 12/02/2016  Decreased Interest 2 0  Down, Depressed, Hopeless 2 0  PHQ - 2 Score 4 0  Altered sleeping 3 -  Tired, decreased energy 3 -  Change in appetite 3 -  Feeling bad or failure about yourself  0 -  Trouble concentrating 2 -  Moving slowly or fidgety/restless 0 -  Suicidal thoughts 0 -  PHQ-9 Score 15 -       Social History  Substance Use Topics  . Smoking status: Never Smoker  . Smokeless tobacco: Never Used     Comment: second hand smoke exposure in home as child,  and adult none last 15 years  . Alcohol use No    Review of Systems Per HPI unless specifically indicated above     Objective:    BP 114/70 (BP Location: Right Arm, Patient Position: Sitting, Cuff Size: Large)   Pulse 61   Temp (!) 97.4 F (36.3 C) (Oral)   Ht _0  (1.727 m)   Wt (!) 343 lb (155.6 kg)   BMI 52.15 kg/m   Wt Readings from Last 3 Encounters:  03/07/17 (!) 343 lb (155.6 kg)  12/05/16 (!) 347 lb (157.4 kg)  12/02/16 (!) 349 lb (158.3 kg)    Physical Exam  Constitutional: She appears well-developed  and well-nourished. No distress.  Morbidly obese  Cardiovascular: Normal rate, regular rhythm and normal heart sounds.   Pulmonary/Chest: Breath sounds normal. No respiratory distress.  Skin: Skin is warm and dry.  Psychiatric: Her speech is normal. Judgment and thought content normal. She is withdrawn. Cognition and memory are normal. She exhibits a depressed mood. She expresses no suicidal ideation. She expresses no suicidal plans.  Tearful at times, but not predominant for visit.   Results for orders placed or performed in visit on 12/02/16  TSH  Result Value Ref Range   TSH 8.09 (H) mIU/L  CBC with Differential  Result Value Ref Range   WBC 8.4 3.8 - 10.8 K/uL   RBC 4.44 3.80 - 5.10 MIL/uL   Hemoglobin 12.2 11.7 - 15.5 g/dL   HCT 37.9 35.0 - 45.0 %   MCV 85.4 80.0 - 100.0 fL   MCH 27.5 27.0 - 33.0 pg   MCHC 32.2 32.0 - 36.0 g/dL   RDW 14.3 11.0 - 15.0 %   Platelets 437 (H) 140 - 400 K/uL   MPV 10.0 7.5 - 12.5 fL   Neutro Abs 4,956 1,500 - 7,800 cells/uL   Lymphs Abs 2,436 850 - 3,900 cells/uL   Monocytes  Absolute 504 200 - 950 cells/uL   Eosinophils Absolute 504 (H) 15 - 500 cells/uL   Basophils Absolute 0 0 - 200 cells/uL   Neutrophils Relative % 59 %   Lymphocytes Relative 29 %   Monocytes Relative 6 %   Eosinophils Relative 6 %   Basophils Relative 0 %   Smear Review Criteria for review not met   hCG, quantitative, pregnancy  Result Value Ref Range   hCG, Beta Chain, Quant, S <2.0 mIU/mL  Testos,Total,Free and SHBG (Female)  Result Value Ref Range   Testosterone,Total,LC/MS/MS 26 2 - 45 ng/dL   Testosterone, Free 3.0 0.1 - 6.4 pg/mL   Sex Hormone Binding Glob. 33 17 - 124 nmol/L      Assessment & Plan:   Problem List Items Addressed This Visit      Other   Depression - Primary    Recently worsened after initial improvement r/t situational loss of husband.  Depression course complicated by grief.  Pt likely would benefit from bereavement or other counseling.  Pt unemployed and previously housewife only.  Plan: 1. Provided self-referral counseling services. 2. Continue lexapro 40 mg once daily. 3. START buspirone 5 mg bid once daily for 1 week.  Increase to twice daily then continue at this dose.  If effective at lower dose, may take lower dose. 4. Reviewed importance of keeping a schedule and setting appointments to leave her home at least once per week. 5. Followup 6 weeks.         Relevant Medications   busPIRone (BUSPAR) 5 MG tablet   Grief    Initially complicated grief w/ severe anhedonia and excessive sleeping.  Improving some in recent weeks.  Pt's husband died approx 4 weeks ago.  Plan: 1. Monitor for complicated grief.  Provided information about this condition to pt. 2. Encouraged social and family support.  Consider counseling and grief therapy. 3. Follow up 6 weeks or sooner if needed.         Meds ordered this encounter  Medications  . busPIRone (BUSPAR) 5 MG tablet    Sig: Take 1 tablet (5 mg total) by mouth 2 (two) times daily.    Dispense:  60 tablet    Refill:  2    Order Specific Question:   Supervising Provider    Answer:   Olin Hauser [[id number]]      Follow up plan: Return in about 6 weeks (around 04/18/2017) for depression.  Cassell Smiles, DNP, AGPCNP-BC Adult Gerontology Primary Care Nurse Practitioner [facility] 03/10/2017, 10:17 PM

## 2017-03-07 NOTE — Patient Instructions (Addendum)
Judeth CornfieldStephanie, Thank you for coming in to clinic today.  1. For your depression: - Continue Lexapro 40 mg once daily - START buspar 5 mg once daily for 1 week.  Increase to twice daily for 1 week. If effective at lower dose, may take lower dose. If helpful, but not completely resolves your symptoms, call clinic for dose increase.  - Increase physical activity to 30 minutes most days of the week. - Schedule a volunteer opportunity at least 1 hour 3 days per week. - Ask the cancer center about a bereavement group or other support.  - PSYCHIATRY / THERAPY-COUSENLING Internal Referral - Let me know if this is where you want to go and I will refer. [facility] - [facility]([place] [facility]) Address: [address], [place], [place] [address] Hours: 8:30AM-5PM Phone: [phone]  Self Referral [facility](Behavioral Health) [facility] [address], [place], [place] [address] Phone: [phone]  [facility], available walk-in 9am-4pm M-F [address] [place], [place] [address] Hours: 9am - 4pm (M-F, walk in available) [phone]  ----------------------------------------------------------------- [facility] ONLY Self Referral: 1. [facility]   Address: [address], [place], [place] [address] Hours: Open today  9AM-7PM Phone: [phone]  2. Anell Barr[facility]yl [facility], [facility]  - [facility] Address: [address], [place], [place] [address] Phone: [phone]   Please schedule a follow-up appointment with Wilhelmina McardleLauren Winda Summerall, AGNP to Return in about 6 weeks (around 04/18/2017) for depression.  If you have any other questions or concerns, please feel free to call the clinic or send a message through MyChart. You may also schedule an earlier appointment if necessary.  Wilhelmina McardleLauren Allissa Albright, DNP, AGNP-BC Adult Gerontology Nurse Practitioner [facility], [facility]     Complicated  Grieving Grief is a normal response to the death of someone close to you. Feelings of fear, anger, and guilt can affect almost everyone who loses a loved one. It is also common to have symptoms of depression while you are grieving. These include problems with sleep, loss of appetite, and lack of energy. They may last for weeks or months after a loss. Complicated grief is different from normal grief or depression. Normal grieving involves sadness and feelings of loss, but these feelings are not constant. Complicated grief is a constant and severe type of grief. It interferes with your ability to function normally. It may last for several months to a year or longer. Complicated grief may require treatment from a mental health care provider. What are the causes? It is not known why some people continue to struggle with grief and others do not. You may be at higher risk for complicated grief if:  The death of your loved one was sudden or unexpected.  The death of your loved one was due to a violent event.  Your loved one committed suicide.  Your loved one was a child or a young person.  You were very close to or dependent on the loved one.  You have a history of depression.  What are the signs or symptoms? Signs and symptoms of complicated grief may include:  Feeling disbelief or numbness.  Being unable to enjoy good memories of your loved one.  Needing to avoid anything that reminds you of your loved one.  Being unable to stop thinking about the death.  Feeling intense anger or guilt.  Feeling alone and hopeless.  Feeling that your life  is meaningless and empty.  Losing the desire to live.  How is this diagnosed? Your health care provider may diagnose complicated grief if:  You have constant symptoms of grief for 6-12 months or longer.  Your symptoms are interfering with your ability to live your life.  Your health care provider may want you to see a mental health care  provider. Many symptoms of depression are similar to the symptoms of complicated grief. It is important to be evaluated for complicated grief along with other mental health conditions. How is this treated? Talk therapy with a mental health provider is the most common treatment for complicated grief. During therapy, you will learn healthy ways to cope with the loss of your loved one. In some cases, your mental health care provider may also recommend antidepressant medicines. Follow these instructions at home:  Take care of yourself. ? Eat regular meals and maintain a healthy diet. Eat plenty of fruits, vegetables, and whole grains. ? Try to get some exercise each day. ? Keep regular hours for sleep. Try to get at least 8 hours of sleep each night.  Do not use drugs or alcohol to ease your symptoms.  Take medicines only as directed by your health care provider.  Spend time with friends and loved ones.  Consider joining a grief (bereavement) support group to help you deal with your loss.  Keep all follow-up visits as directed by your health care provider. This is important. Contact a health care provider if:  Your symptoms keep you from functioning normally.  Your symptoms do not get better with treatment. Get help right away if:  You have serious thoughts of hurting yourself or someone else.  You have suicidal feelings. This information is not intended to replace advice given to you by your health care provider. Make sure you discuss any questions you have with your health care provider. Document Released: 08/15/2005 Document Revised: 01/21/2016 Document Reviewed: 01/23/2014 Elsevier Interactive Patient Education  [employer].

## 2017-03-10 DIAGNOSIS — F4321 Adjustment disorder with depressed mood: Secondary | ICD-10-CM | POA: Insufficient documentation

## 2017-03-10 NOTE — Progress Notes (Signed)
I have reviewed this encounter including the documentation in this note and/or discussed this patient with the provider, Wilhelmina McardleLauren Kennedy, AGPCNP-BC. I am certifying that I agree with the content of this note as supervising physician.  Saralyn PilarAlexander Melesio Madara, DO [facility] 03/10/2017, 11:46 PM

## 2017-03-10 NOTE — Assessment & Plan Note (Signed)
Initially complicated grief w/ severe anhedonia and excessive sleeping.  Improving some in recent weeks.  Pt's husband died approx 4 weeks ago.  Plan: 1. Monitor for complicated grief.  Provided information about this condition to pt. 2. Encouraged social and family support.  Consider counseling and grief therapy. 3. Follow up 6 weeks or sooner if needed.

## 2017-03-10 NOTE — Assessment & Plan Note (Addendum)
Recently worsened after initial improvement r/t situational loss of husband.  Depression course complicated by grief.  Pt likely would benefit from bereavement or other counseling. Pt unemployed and previously housewife only.  Plan: 1. Provided self-referral counseling services. 2. Continue lexapro 40 mg once daily. 3. START buspirone 5 mg bid once daily for 1 week.  Increase to twice daily then continue at this dose.  If effective at lower dose, may take lower dose. 4. Reviewed importance of keeping a schedule and setting appointments to leave her home at least once per week. 5. Followup 6 weeks.

## 2017-03-14 ENCOUNTER — Telehealth: Payer: Self-pay | Admitting: Nurse Practitioner

## 2017-03-14 NOTE — Telephone Encounter (Signed)
Pt had requested bereavement resources at last visit.   Can make a referral for [facility] for family bereavement after death if she would like.  Called and left a message for her to return my call.

## 2017-03-15 NOTE — Telephone Encounter (Signed)
I called and left a message on MJ from Grief Services at [facility]. I'm awaiting response from how to arrange the services.

## 2017-03-16 ENCOUNTER — Encounter: Payer: Self-pay | Admitting: Nurse Practitioner

## 2017-03-16 NOTE — Telephone Encounter (Signed)
I attempted to contact the pt no answer and unable to leave a message because the mailbox is full. I'm reaching out to the pt to give her the information for Grief Support and Counseling Services through [facility]. I spoke with MJ who handles the scheduling for grieving services and she informed me that all the pt needs to do is to call her at [phone] between the hours of 8-5pm Monday- Thursday and she will get her set -up.

## 2017-03-20 ENCOUNTER — Encounter: Payer: Self-pay | Admitting: Nurse Practitioner

## 2017-03-20 ENCOUNTER — Ambulatory Visit (INDEPENDENT_AMBULATORY_CARE_PROVIDER_SITE_OTHER): Payer: BLUE CROSS/BLUE SHIELD | Admitting: Nurse Practitioner

## 2017-03-20 VITALS — BP 105/62 | HR 58 | Temp 97.8°F | Ht 68.0 in | Wt 345.5 lb

## 2017-03-20 DIAGNOSIS — J40 Bronchitis, not specified as acute or chronic: Secondary | ICD-10-CM | POA: Diagnosis not present

## 2017-03-20 MED ORDER — ALBUTEROL SULFATE HFA 108 (90 BASE) MCG/ACT IN AERS: 1.0000 | INHALATION_SPRAY | Freq: Four times a day (QID) | RESPIRATORY_TRACT | 5 refills | Status: AC | PRN

## 2017-03-20 MED ORDER — BENZONATATE 100 MG PO CAPS: 100.0000 mg | ORAL_CAPSULE | Freq: Two times a day (BID) | ORAL | 0 refills | Status: DC | PRN

## 2017-03-20 NOTE — Progress Notes (Signed)
Subjective:    Patient ID: Linda Bell, female    DOB: October 30, 1975, 41 y.o.   MRN: [id number]  Linda Bell is a 41 y.o. female presenting on 03/20/2017 for Cough (sneezing, runny nose and post-nasal drainage x 4 days)   HPI  Cough/URI Pt presents w/ sore throat, sneezing, runny nose.  Then cough x about 4 days. No sinus pressure.  Feels chest congestion/pressure worse w/ coughing.  Cough is not productive. Denies fever, chills, sweats,  Nausea vomiting headaches.  Has taken Allegra once daily. Not regularly and has had some relief when taking.  Pt notes seasonal allergies, but has not been taking Allegra continuously since allergy season.   Social History  Substance Use Topics  . Smoking status: Never Smoker  . Smokeless tobacco: Never Used     Comment: second hand smoke exposure in home as child,  and adult none last 15 years  . Alcohol use No    Review of Systems Per HPI unless specifically indicated above     Objective:    BP 105/62 (BP Location: Right Arm, Patient Position: Sitting, Cuff Size: Large)   Pulse (!) 58   Temp 97.8 F (36.6 C) (Oral)   Ht 5' 8"  (1.727 m)   Wt (!) 345 lb 8 oz (156.7 kg)   SpO2 98%   BMI 52.53 kg/m   Wt Readings from Last 3 Encounters:  03/20/17 (!) 345 lb 8 oz (156.7 kg)  03/07/17 (!) 343 lb (155.6 kg)  12/05/16 (!) 347 lb (157.4 kg)    Physical Exam  Constitutional: She is oriented to person, place, and time. She appears well-developed and well-nourished. No distress.  HENT:  Head: Normocephalic and atraumatic.  Right Ear: External ear normal.  Left Ear: External ear normal.  Nose: Nose normal.  Mouth/Throat: Oropharynx is clear and moist.  Neck: Normal range of motion. Neck supple. No JVD present. No tracheal deviation present. No thyromegaly present.  Cardiovascular: Normal rate, regular rhythm, normal heart sounds and intact distal pulses.   Pulmonary/Chest: Effort normal. She has rales in the right upper field, the  right lower field, the left upper field and the left lower field.  Lymphadenopathy:    She has no cervical adenopathy.  Neurological: She is alert and oriented to person, place, and time.  Skin: Skin is warm and dry.  Psychiatric: She has a normal mood and affect. Her behavior is normal. Judgment and thought content normal.     Results for orders placed or performed in visit on 12/02/16  TSH  Result Value Ref Range   TSH 8.09 (H) mIU/L  CBC with Differential  Result Value Ref Range   WBC 8.4 3.8 - 10.8 K/uL   RBC 4.44 3.80 - 5.10 MIL/uL   Hemoglobin 12.2 11.7 - 15.5 g/dL   HCT 37.9 35.0 - 45.0 %   MCV 85.4 80.0 - 100.0 fL   MCH 27.5 27.0 - 33.0 pg   MCHC 32.2 32.0 - 36.0 g/dL   RDW 14.3 11.0 - 15.0 %   Platelets 437 (H) 140 - 400 K/uL   MPV 10.0 7.5 - 12.5 fL   Neutro Abs 4,956 1,500 - 7,800 cells/uL   Lymphs Abs 2,436 850 - 3,900 cells/uL   Monocytes Absolute 504 200 - 950 cells/uL   Eosinophils Absolute 504 (H) 15 - 500 cells/uL   Basophils Absolute 0 0 - 200 cells/uL   Neutrophils Relative % 59 %   Lymphocytes Relative 29 %   Monocytes Relative  6 %   Eosinophils Relative 6 %   Basophils Relative 0 %   Smear Review Criteria for review not met   hCG, quantitative, pregnancy  Result Value Ref Range   hCG, Beta Chain, Quant, S <2.0 mIU/mL  Testos,Total,Free and SHBG (Female)  Result Value Ref Range   Testosterone,Total,LC/MS/MS 26 2 - 45 ng/dL   Testosterone, Free 3.0 0.1 - 6.4 pg/mL   Sex Hormone Binding Glob. 33 17 - 124 nmol/L      Assessment & Plan:   Problem List Items Addressed This Visit    None    Visit Diagnoses    Bronchitis    -  Primary Cough and symptoms c/w bronchitis likely viral in nature complicated by allergies.  Plan: 1. Pt may use albuterol as needed for shortness of breath. 2. Take benzonatate 100 mg bid as needed for cough. 3. Follow up 5-7 days if not improving and will consider antibiotic.     Relevant Medications   albuterol  (PROVENTIL HFA;VENTOLIN HFA) 108 (90 Base) MCG/ACT inhaler   benzonatate (TESSALON) 100 MG capsule      Meds ordered this encounter  Medications  . albuterol (PROVENTIL HFA;VENTOLIN HFA) 108 (90 Base) MCG/ACT inhaler    Sig: Inhale 1-2 puffs into the lungs every 6 (six) hours as needed for wheezing or shortness of breath.    Dispense:  1 Inhaler    Refill:  5    Order Specific Question:   Supervising Provider    Answer:   Olin Hauser [[id number]]  . benzonatate (TESSALON) 100 MG capsule    Sig: Take 1 capsule (100 mg total) by mouth 2 (two) times daily as needed for cough.    Dispense:  20 capsule    Refill:  0    Order Specific Question:   Supervising Provider    Answer:   Olin Hauser [[id number]]      Follow up plan: Return 5-7 days if symptoms worsen or fail to improve.   Cassell Smiles, DNP, AGPCNP-BC Adult Gerontology Primary Care Nurse Practitioner [facility] 03/20/2017, 3:28 PM

## 2017-03-20 NOTE — Patient Instructions (Addendum)
Linda Bell, Thank you for coming in to clinic today.  1. You likely have a viral bronchitis. Call in 5-7 days if no improvement.  We will consider antibiotics.  - Take benzonatate 100 mg capsules twice daily as needed for cough. - Mucinex for congestion - Take allegra daily to prevent your allergy and mucous production. - albuterol: take 2 puffs every 6 hours for shortness of breath or difficulty breathing.   Please schedule a follow-up appointment with Wilhelmina McardleLauren Anjolie Majer, AGNP. Return 5-7 days if symptoms worsen or fail to improve.  If you have any other questions or concerns, please feel free to call the clinic or send a message through MyChart. You may also schedule an earlier appointment if necessary.  You will receive a survey after today's visit either digitally by e-mail or paper by [employer] mail. Your experiences and feedback matter to us.  Please respond so we know how we are doing as we provide care for you.   Wilhelmina McardleLauren Chang Tiggs, DNP, AGNP-BC Adult Gerontology Nurse Practitioner [facility], [facility]

## 2017-03-20 NOTE — Progress Notes (Signed)
I have reviewed this encounter including the documentation in this note and/or discussed this patient with the provider, Wilhelmina McardleLauren Kennedy, AGPCNP-BC. I am certifying that I agree with the content of this note as supervising physician.  Saralyn PilarAlexander Marketta Valadez, DO [facility] 03/20/2017, 5:27 PM

## 2017-04-06 ENCOUNTER — Encounter: Payer: Self-pay | Admitting: Nurse Practitioner

## 2017-04-06 ENCOUNTER — Ambulatory Visit (INDEPENDENT_AMBULATORY_CARE_PROVIDER_SITE_OTHER): Payer: BLUE CROSS/BLUE SHIELD | Admitting: Nurse Practitioner

## 2017-04-06 VITALS — BP 114/57 | HR 60 | Temp 98.2°F | Ht 68.0 in | Wt 344.0 lb

## 2017-04-06 DIAGNOSIS — G5631 Lesion of radial nerve, right upper limb: Secondary | ICD-10-CM

## 2017-04-06 MED ORDER — GABAPENTIN 100 MG PO CAPS: 100.0000 mg | ORAL_CAPSULE | Freq: Three times a day (TID) | ORAL | 0 refills | Status: DC | PRN

## 2017-04-06 NOTE — Progress Notes (Signed)
Subjective:    Patient ID: Linda Bell, female    DOB: 04-09-1976, 41 y.o.   MRN: [id number]  Linda Bell is a 41 y.o. female presenting on 04/06/2017 for Arm Pain (right arm pain. Pt reports the arm is swollen and throbbing. )   HPI Right Arm Pain Onset today when she woke up.  Notes that when she moves her arm she has a "grabbing pain" that is sharp and aching w/ arm extension.  Keeping it bent and limiting movement for now to prevent pain.   May have some muscle cramping. Also noted swelling Right arm > than Left arm.  Precipitating factors: Was lying in bed using her phone last night.  Keeps right arm on pillow and scrolls w/ right thumb.  Pt sleeps on left side most of night.  Short periods on right side only.  Also occasionally on stomach.  Has not taken any medication or tried any alleviating factors today.  Social History  Substance Use Topics  . Smoking status: Never Smoker  . Smokeless tobacco: Never Used     Comment: second hand smoke exposure in home as child,  and adult none last 15 years  . Alcohol use No    Review of Systems Per HPI unless specifically indicated above     Objective:    BP (!) 114/57 (BP Location: Left Arm, Patient Position: Sitting, Cuff Size: Large)   Pulse 60   Temp 98.2 F (36.8 C) (Oral)   Ht 5' 8"  (1.727 m)   Wt (!) 344 lb (156 kg)   BMI 52.31 kg/m   Wt Readings from Last 3 Encounters:  04/06/17 (!) 344 lb (156 kg)  03/20/17 (!) 345 lb 8 oz (156.7 kg)  03/07/17 (!) 343 lb (155.6 kg)    Physical Exam  Constitutional: She is oriented to person, place, and time. She appears well-developed and well-nourished. No distress.  Neck: Normal range of motion. Neck supple. No spinous process tenderness and no muscular tenderness present.  Cardiovascular: Normal rate, regular rhythm and normal heart sounds.   Pulmonary/Chest: Effort normal and breath sounds normal. No respiratory distress. She has no wheezes.  Musculoskeletal:   Right shoulder: She exhibits normal range of motion, no tenderness, no bony tenderness, no swelling, no pain and no spasm.       Right elbow: She exhibits decreased range of motion. She exhibits no swelling and no effusion. Tenderness found. Radial head tenderness noted. No medial epicondyle, no lateral epicondyle and no olecranon process tenderness noted.       Right wrist: Normal. She exhibits normal range of motion and no tenderness.       Right upper arm: She exhibits tenderness and swelling. She exhibits no bony tenderness, no deformity and no laceration.       Right forearm: Normal.       Arms:      Right hand: Normal. Normal sensation noted. Normal strength noted.  Neurological: She is alert and oriented to person, place, and time. No cranial nerve deficit.  Skin: Skin is warm and dry.  Psychiatric: She has a normal mood and affect. Her behavior is normal. Thought content normal.  Vitals reviewed.   Results for orders placed or performed in visit on 12/02/16  TSH  Result Value Ref Range   TSH 8.09 (H) mIU/L  CBC with Differential  Result Value Ref Range   WBC 8.4 3.8 - 10.8 K/uL   RBC 4.44 3.80 - 5.10 MIL/uL   Hemoglobin 12.2  11.7 - 15.5 g/dL   HCT 37.9 35.0 - 45.0 %   MCV 85.4 80.0 - 100.0 fL   MCH 27.5 27.0 - 33.0 pg   MCHC 32.2 32.0 - 36.0 g/dL   RDW 14.3 11.0 - 15.0 %   Platelets 437 (H) 140 - 400 K/uL   MPV 10.0 7.5 - 12.5 fL   Neutro Abs 4,956 1,500 - 7,800 cells/uL   Lymphs Abs 2,436 850 - 3,900 cells/uL   Monocytes Absolute 504 200 - 950 cells/uL   Eosinophils Absolute 504 (H) 15 - 500 cells/uL   Basophils Absolute 0 0 - 200 cells/uL   Neutrophils Relative % 59 %   Lymphocytes Relative 29 %   Monocytes Relative 6 %   Eosinophils Relative 6 %   Basophils Relative 0 %   Smear Review Criteria for review not met   hCG, quantitative, pregnancy  Result Value Ref Range   hCG, Beta Chain, Quant, S <2.0 mIU/mL  Testos,Total,Free and SHBG (Female)  Result Value Ref  Range   Testosterone,Total,LC/MS/MS 26 2 - 45 ng/dL   Testosterone, Free 3.0 0.1 - 6.4 pg/mL   Sex Hormone Binding Glob. 33 17 - 124 nmol/L      Assessment & Plan:   Problem List Items Addressed This Visit    None    Visit Diagnoses    Radial nerve irritation, right    -  Primary Pain likely self-limited.  Muscle strain, arm swelling, and nerve irritiation possible complicated by prolonged period of time in position w/ stress when using phone.  Plan:  1. Treat with NSAIDs (acetaminophen and ibuprofen).  Discussed alternate dosing and max dosing. 2. Apply heat and/or ice to affected area. 3. May also apply a muscle rub with lidocaine after heat or ice. 4. Take gabapentin up to three times daily.  Cautioned drowsiness. 5. Encouraged pt to keep elevated, continue movement as tolerated.  6. Follow up as needed in 5-7 days if no improvement.    Relevant Medications   gabapentin (NEURONTIN) 100 MG capsule      Meds ordered this encounter  Medications  . gabapentin (NEURONTIN) 100 MG capsule    Sig: Take 1 capsule (100 mg total) by mouth 3 (three) times daily as needed. OR 2-3 capsules at bedtime for nerve pain.    Dispense:  45 capsule    Refill:  0    Order Specific Question:   Supervising Provider    Answer:   Olin Hauser [[id number]]      Follow up plan: Return 5-7 days if symptoms worsen or fail to improve.   Cassell Smiles, DNP, AGPCNP-BC Adult Gerontology Primary Care Nurse Practitioner [facility] 04/07/2017, 12:55 PM

## 2017-04-06 NOTE — Patient Instructions (Addendum)
Judeth CornfieldStephanie, Thank you for coming in to clinic today.  1. You have nerve irritation and soft tissue swelling:  - Start taking Tylenol extra strength 1 to 2 tablets every 6-8 hours for aches or fever/chills for next few days as needed.  Do not take more than 3,000 mg in 24 hours from all medicines.  May take Ibuprofen as well if tolerated 200-400mg  every 8 hours as needed. - Use heat and ice.  Apply this for 15 minutes at a time 6-8 times per day.   - Muscle rub with lidocaine.  Avoid using this with heat and ice to avoid burns. - Take gabapentin 100 mg up to three times daily as needed for pain OR take 2-3 tablets at bedtime.  This medicine is likely to make you drowsy. - Elevate your arm on a pillow when resting.   Please schedule a follow-up appointment with Wilhelmina McardleLauren Sundi Slevin, AGNP. Return 5-7 days if symptoms worsen or fail to improve.  If you have any other questions or concerns, please feel free to call the clinic or send a message through MyChart. You may also schedule an earlier appointment if necessary.  You will receive a survey after today's visit either digitally by e-mail or paper by [employer] mail. Your experiences and feedback matter to us.  Please respond so we know how we are doing as we provide care for you.   Wilhelmina McardleLauren Jamey Demchak, DNP, AGNP-BC Adult Gerontology Nurse Practitioner [facility], [facility]

## 2017-04-07 NOTE — Progress Notes (Signed)
I have reviewed this encounter including the documentation in this note and/or discussed this patient with the provider, Wilhelmina McardleLauren Kennedy, AGPCNP-BC. I am certifying that I agree with the content of this note as supervising physician.  Saralyn PilarAlexander Laquasha Groome, DO [facility] 04/07/2017, 1:05 PM

## 2017-04-18 ENCOUNTER — Ambulatory Visit (INDEPENDENT_AMBULATORY_CARE_PROVIDER_SITE_OTHER): Payer: BLUE CROSS/BLUE SHIELD | Admitting: Nurse Practitioner

## 2017-04-18 ENCOUNTER — Telehealth: Payer: Self-pay | Admitting: Nurse Practitioner

## 2017-04-18 VITALS — BP 112/60 | HR 57 | Temp 98.7°F | Ht 68.0 in | Wt 343.5 lb

## 2017-04-18 DIAGNOSIS — F331 Major depressive disorder, recurrent, moderate: Secondary | ICD-10-CM | POA: Diagnosis not present

## 2017-04-18 DIAGNOSIS — F4321 Adjustment disorder with depressed mood: Secondary | ICD-10-CM

## 2017-04-18 DIAGNOSIS — E039 Hypothyroidism, unspecified: Secondary | ICD-10-CM

## 2017-04-18 DIAGNOSIS — F4320 Adjustment disorder, unspecified: Secondary | ICD-10-CM

## 2017-04-18 DIAGNOSIS — Z6841 Body Mass Index (BMI) 40.0 and over, adult: Secondary | ICD-10-CM

## 2017-04-18 DIAGNOSIS — F419 Anxiety disorder, unspecified: Secondary | ICD-10-CM | POA: Diagnosis not present

## 2017-04-18 MED ORDER — FLUOXETINE HCL 60 MG PO TABS: 60.0000 mg | ORAL_TABLET | Freq: Every day | ORAL | 2 refills | Status: DC

## 2017-04-18 MED ORDER — FLUOXETINE HCL 20 MG PO TABS: 60.0000 mg | ORAL_TABLET | Freq: Every day | ORAL | 3 refills | Status: DC

## 2017-04-18 MED ORDER — BUSPIRONE HCL 10 MG PO TABS: 10.0000 mg | ORAL_TABLET | Freq: Two times a day (BID) | ORAL | 2 refills | Status: DC

## 2017-04-18 NOTE — Patient Instructions (Addendum)
Linda Bell, Thank you for coming in to clinic today.  1. You will be due for FASTING BLOOD WORK (no food or drink after midnight before, only water or coffee without cream/sugar on the morning of) - Please go ahead and schedule a "Lab Only" visit in the morning at the clinic for lab draw in the next 2 days  For Lab Results, once available within 2-3 days of blood draw, you can can log in to MyChart online to view your results and a brief explanation. Also, we can discuss results at next follow-up visit.   2. For your depression: - INCREASE prozac to 60mg  once daily.  - INCREASE buspar to 10 mg twice daily.  Until you get your new medicine, take 2 tablets twice daily.   Please schedule a follow-up appointment with Wilhelmina Mcardle, AGNP. Return in about 2 months (around 06/18/2017) for depression or sooner if needed.  We can make medicine changes in 4 weeks if you need.  If you have any other questions or concerns, please feel free to call the clinic or send a message through MyChart. You may also schedule an earlier appointment if necessary.  You will receive a survey after today's visit either digitally by e-mail or paper by [employer]. Your experiences and feedback matter to [place].  Please respond so we know how we are doing as we provide care for you.   Wilhelmina Mcardle, DNP, AGNP-BC Adult Gerontology Nurse Practitioner [facility], [facility]

## 2017-04-18 NOTE — Telephone Encounter (Signed)
Joe at pharmacy said the fluoxetine 30 mg will cost pt $80 but if prescription is written for 20 mg capsules, take 3 once daily it will only cost $4.  Joe's call back number is [phone]

## 2017-04-18 NOTE — Progress Notes (Signed)
Subjective:    Patient ID: [place], female    DOB: 1976/08/08, 41 y.o.   MRN: [id number]  [place] is a 41 y.o. female presenting on 04/18/2017 for Depression (f/u visit )   HPI Depression, Anxiety, and Grief Has gotten into grief counseling.  Pt is currently taking Prozac 40 mg and Buspar 5 mg bid. No improvement per pt w/ buspar. - Remains anxious and jittery and "gets obsessed over things."  Rumination of thoughts. - Is having significantly difficult time focusing.  Can't go to grocery store and shop from list.  Distractions. - Daytime: gets up daily for cleaning, Now moving to make bedroom less about her husband's presence.  Does leave home a couple times per week. - Improved appetite.  Hypothyroidism Missing doses about 2-3x per week.  Has not had TSH recheck since last dose change > 6 weeks ago.  Pt states she is taking his levothyroxine in the am at least 1 hour before eating or drinking and taking other medicines.  She denies heart racing, heart palpitations, heat and cold intolerance, changes in hair/skin/nails, and lower leg swelling. She does not have any compressive symptoms to include difficulty swallowing, globus sensation, or difficulty breathing when lying flat.   Obesity Appetite is improving - less binge eating than initially after husband's death. Now stable weight.  Pt is not active and does not currently desire to start exercising.   Social History  Substance Use Topics  . Smoking status: Never Smoker  . Smokeless tobacco: Never Used     Comment: second hand smoke exposure in home as child,  and adult none last 15 years  . Alcohol use No    Review of Systems Per HPI unless specifically indicated above     Objective:    BP 112/60 (BP Location: Right Arm, Patient Position: Sitting, Cuff Size: Large)   Pulse (!) 57   Temp 98.7 F (37.1 C) (Oral)   Ht 5\' 8"  (1.727 m)   Wt (!) 343 lb 8 oz (155.8 kg)   SpO2 98%   BMI 52.23 kg/m   Wt  Readings from Last 3 Encounters:  04/18/17 (!) 343 lb 8 oz (155.8 kg)  04/06/17 (!) 344 lb (156 kg)  03/20/17 (!) 345 lb 8 oz (156.7 kg)    Physical Exam  General - morbidly obese, well-appearing, NAD HEENT - Normocephalic, atraumatic, PERRL, EOMI, patent nares w/o congestion, oropharynx clear, MMM Neck - supple, non-tender, no LAD, thyromegaly noted on palpation not visible to naked eye Heart - RRR, no murmurs heard Lungs - Clear throughout all lobes, no wheezing, crackles, or rhonchi. Normal work of breathing. Abdomen - soft, NTND, no masses, no hepatosplenomegaly, active bowel sounds Extremeties - non-tender, no edema, cap refill < 2 seconds, peripheral pulses intact +2 bilaterally Skin - warm, dry, no rashes Neuro - awake, alert, oriented x3, normal gait Psych - Depressed mood and affect, normal behavior.  Not withdrawn.  Results for orders placed or performed in visit on 04/18/17  TSH  Result Value Ref Range   TSH 5.88 (H) mIU/L  Lipid panel  Result Value Ref Range   Cholesterol 169 <200 mg/dL   Triglycerides 76 <031 mg/dL   HDL 45 (L) >59 mg/dL   Total CHOL/HDL Ratio 3.8 <5.0 Ratio   VLDL 15 <30 mg/dL   LDL Cholesterol 458 (H) <100 mg/dL      Assessment & Plan:   Problem List Items Addressed This Visit      Endocrine  Hypothyroid - Primary   Relevant Orders   TSH (Completed)   Lipid panel (Completed)     Other   Depression    Uncontrolled, but complicated by grief.  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  Established goal for self to try to resume work about 6 months after husbands death.  Plan: 1. Increase lexapro 60 mg once daily. 3. Increase buspirone 10 mg bid. 4. Reviewed importance of keeping a schedule and setting appointments to leave her home at least 2-3 times per week.  Pt beginning to accept this and is trying to find a way to connect to the community that she will enjoy. 5. Follow-up 6 weeks for med  management or sooner if needed.         Relevant Medications   busPIRone (BUSPAR) 10 MG tablet   Anxiety    See AP for depression      Relevant Medications   busPIRone (BUSPAR) 10 MG tablet   BMI 45.0-49.9, adult (HCC)    STABLE w/o weight gain in last 6 weeks. Pt w/o desire to focus on weight loss at this time.  Plan: 1. Discussed eating healthier food choices, continuing to avoid binge eating. 2. Follow up about 3 months.      Relevant Orders   TSH (Completed)   Lipid panel (Completed)   Grief    Initially complicated grief w/ severe anhedonia, excessive sleeping, binge eating.  Improving some in recent weeks.  Pt now connected w/ grief counselor.  Pt's husband died approx 3 months ago.  Plan: 1. Monitor for complicated grief.  Continue w/ grief counseling. 2. Encouraged social and family support.  3. Follow up 6 weeks or sooner if needed.         Meds ordered this encounter  Medications  . FLUoxetine HCl 20 MG TABS    Sig: Take 3 tablets (60 mg total) by mouth daily.    Dispense:  90 tablet    Refill:  2    Order Specific Question:   Supervising Provider    Answer:   Smitty Cords [[id number]]  . busPIRone (BUSPAR) 10 MG tablet    Sig: Take 1 tablet (10 mg total) by mouth 2 (two) times daily.    Dispense:  60 tablet    Refill:  2    Order Specific Question:   Supervising Provider    Answer:   Smitty Cords [[id number]]      Follow up plan: Return in about 2 months (around 06/18/2017) for depression or sooner if needed.  We can make medicine changes in 4 weeks if you need.   Linda Mcardle, DNP, AGPCNP-BC Adult Gerontology Primary Care Nurse Practitioner [facility] 04/21/2017, 10:27 AM

## 2017-04-19 ENCOUNTER — Other Ambulatory Visit: Payer: BLUE CROSS/BLUE SHIELD

## 2017-04-19 LAB — TSH: TSH: 5.88 mIU/L — ABNORMAL HIGH

## 2017-04-20 LAB — LIPID PANEL
Cholesterol: 169 mg/dL (ref <200)
HDL: 45 mg/dL — ABNORMAL LOW (ref >50)
LDL Cholesterol: 109 mg/dL — ABNORMAL HIGH (ref <100)
Total CHOL/HDL Ratio: 3.8 Ratio (ref <5.0)
Triglycerides: 76 mg/dL (ref <150)
VLDL: 15 mg/dL (ref <30)

## 2017-04-21 ENCOUNTER — Encounter: Payer: Self-pay | Admitting: Nurse Practitioner

## 2017-04-21 NOTE — Assessment & Plan Note (Signed)
STABLE w/o weight gain in last 6 weeks. Pt w/o desire to focus on weight loss at this time.  Plan: 1. Discussed eating healthier food choices, continuing to avoid binge eating. 2. Follow up about 3 months.

## 2017-04-21 NOTE — Assessment & Plan Note (Signed)
See A&P for depression

## 2017-04-21 NOTE — Progress Notes (Signed)
I have reviewed this encounter including the documentation in this note and/or discussed this patient with the provider, Lauren Kennedy, AGPCNP-BC. I am certifying that I agree with the content of this note as supervising physician.  Cleatis Fandrich, DO [facility] 04/21/2017, 2:45 PM 

## 2017-04-21 NOTE — Assessment & Plan Note (Signed)
Uncontrolled, but complicated by grief.  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  Established goal for self to try to resume work about 6 months after husbands death.  Plan: 1. Increase lexapro 60 mg once daily. 3. Increase buspirone 10 mg bid. 4. Reviewed importance of keeping a schedule and setting appointments to leave her home at least 2-3 times per week.  Pt beginning to accept this and is trying to find a way to connect to the community that she will enjoy. 5. Follow-up 6 weeks for med management or sooner if needed.

## 2017-04-21 NOTE — Assessment & Plan Note (Signed)
Initially complicated grief w/ severe anhedonia, excessive sleeping, binge eating.  Improving some in recent weeks.  Pt now connected w/ grief counselor.  Pt's husband died approx 3 months ago.  Plan: 1. Monitor for complicated grief.  Continue w/ grief counseling. 2. Encouraged social and family support.  3. Follow up 6 weeks or sooner if needed.

## 2017-06-04 ENCOUNTER — Other Ambulatory Visit: Payer: Self-pay | Admitting: Nurse Practitioner

## 2017-06-04 MED ORDER — FLUOXETINE HCL 20 MG PO TABS: 60.0000 mg | ORAL_TABLET | Freq: Every day | ORAL | 1 refills | Status: DC

## 2017-06-19 ENCOUNTER — Ambulatory Visit: Payer: BLUE CROSS/BLUE SHIELD | Admitting: Nurse Practitioner

## 2017-06-30 ENCOUNTER — Other Ambulatory Visit: Payer: Self-pay | Admitting: Nurse Practitioner

## 2017-07-03 ENCOUNTER — Other Ambulatory Visit: Payer: Self-pay

## 2017-07-03 MED ORDER — METOPROLOL SUCCINATE ER 50 MG PO TB24: 50.0000 mg | ORAL_TABLET | Freq: Every day | ORAL | 5 refills | Status: DC

## 2017-11-02 ENCOUNTER — Other Ambulatory Visit: Payer: Self-pay

## 2017-11-03 ENCOUNTER — Other Ambulatory Visit: Payer: Self-pay

## 2017-11-03 ENCOUNTER — Ambulatory Visit: Payer: Self-pay | Admitting: Nurse Practitioner

## 2017-11-03 ENCOUNTER — Encounter: Payer: Self-pay | Admitting: Nurse Practitioner

## 2017-11-03 VITALS — BP 106/70 | HR 64 | Temp 98.3°F | Ht 68.0 in | Wt 325.4 lb

## 2017-11-03 DIAGNOSIS — I498 Other specified cardiac arrhythmias: Secondary | ICD-10-CM

## 2017-11-03 DIAGNOSIS — F419 Anxiety disorder, unspecified: Secondary | ICD-10-CM

## 2017-11-03 DIAGNOSIS — F331 Major depressive disorder, recurrent, moderate: Secondary | ICD-10-CM

## 2017-11-03 DIAGNOSIS — F410 Panic disorder [episodic paroxysmal anxiety] without agoraphobia: Secondary | ICD-10-CM

## 2017-11-03 DIAGNOSIS — E039 Hypothyroidism, unspecified: Secondary | ICD-10-CM

## 2017-11-03 MED ORDER — FLUOXETINE HCL 20 MG PO CAPS: 60.0000 mg | ORAL_CAPSULE | Freq: Every day | ORAL | 5 refills | Status: DC

## 2017-11-03 MED ORDER — BUSPIRONE HCL 10 MG PO TABS: 15.0000 mg | ORAL_TABLET | Freq: Two times a day (BID) | ORAL | 2 refills | Status: DC

## 2017-11-03 MED ORDER — HYDROXYZINE HCL 25 MG PO TABS: 12.5000 mg | ORAL_TABLET | Freq: Three times a day (TID) | ORAL | 2 refills | Status: DC | PRN

## 2017-11-03 MED ORDER — METOPROLOL SUCCINATE ER 50 MG PO TB24: 50.0000 mg | ORAL_TABLET | Freq: Every day | ORAL | 12 refills | Status: DC

## 2017-11-03 NOTE — Patient Instructions (Signed)
Judeth CornfieldStephanie, Thank you for coming in to clinic today.  1. PSYCHIATRY / THERAPY-COUSENLING Self Referral [facility](Behavioral Health) [place] [address], [place], [place] [address] Phone: [phone]  [facility], available walk-in 9am-4pm M-F [address] [place], [place] [address] Hours: 9am - 4pm (M-F, walk in available) [phone]  [facility]   Address: [address], [place], [place] [address] Hours: Tues 4:15PM-8PM // Weds 9am - 12pm // Magdalene Mollyhurs 1pm - 8pm (Closed other days) Phone: [phone]  Please schedule a follow-up appointment with Wilhelmina McardleLauren Teonna Coonan, AGNP. No Follow-up on file.  If you have any other questions or concerns, please feel free to call the clinic or send a message through MyChart. You may also schedule an earlier appointment if necessary.  You will receive a survey after today's visit either digitally by e-mail or paper by [employer] mail. Your experiences and feedback matter to us.  Please respond so we know how we are doing as we provide care for you.   Wilhelmina McardleLauren Dmitri Pettigrew, DNP, AGNP-BC Adult Gerontology Nurse Practitioner [facility]

## 2017-11-03 NOTE — Progress Notes (Signed)
Subjective:    Patient ID: [place], female    DOB: Oct 10, 1975, 42 y.o.   MRN: [id number]  Linda HousemanStephanie Gertsch is a 42 y.o. female presenting on 11/03/2017 for Anxiety   HPI Anxiety and Depression Pt is beginning to cope with her husbands death in better manner, however she notes she is still avoiding coping and grieving. - Most significant to patient at this time is continuing to have some panic in public, shaking inside, "wants to jump out of [her] skin." - Pt is now staying with a best friend.  She is not living in her old home anymore.  Has been there since October.  Goes home on weekends an doccasionally during the week.  Best friend is disabled.  Now helping her is helping pt be more purposeful.  Admits the change was to initially avoid being at home.  Now, seems to be able to go home easier on weekends.  Heart Arrhythmia: Pt has had heart arrhythmia in past and is taking metoprolol daily for rate control.  Pt denes any heart racing or palpitations.  She is tolerating medication well without side effects.  Hypothyroidism - Pt states she is taking her levothyroxine 50mcg in the am at least 1 hour before eating or drinking and taking other medicines.   - She is not currently symptomatic. - She denies, fatigue, excess energy, weight changes, heart racing, heart palpitations, heat and cold intolerance, changes in hair/skin/nails, and lower leg swelling.  - She does not have any compressive symptoms to include difficulty swallowing, globus sensation, or difficulty breathing when lying flat.   Social History   Tobacco Use  . Smoking status: Never Smoker  . Smokeless tobacco: Never Used  . Tobacco comment: second hand smoke exposure in home as child,  and adult none last 15 years  Substance Use Topics  . Alcohol use: No  . Drug use: No    Review of Systems Per HPI unless specifically indicated above     Objective:    BP 106/70 (BP Location: Right Arm, Patient Position:  Sitting, Cuff Size: Large)   Pulse 64   Temp 98.3 F (36.8 C) (Oral)   Ht 5\' 8"  (1.727 m)   Wt (!) 325 lb 6.4 oz (147.6 kg)   BMI 49.48 kg/m   Wt Readings from Last 3 Encounters:  11/03/17 (!) 325 lb 6.4 oz (147.6 kg)  04/18/17 (!) 343 lb 8 oz (155.8 kg)  04/06/17 (!) 344 lb (156 kg)    Physical Exam  Constitutional: She is oriented to person, place, and time. She appears well-developed and well-nourished. No distress.  HENT:  Head: Normocephalic and atraumatic.  Cardiovascular: Normal rate, regular rhythm, S1 normal, S2 normal, normal heart sounds and intact distal pulses.  Pulmonary/Chest: Effort normal and breath sounds normal. No respiratory distress.  Neurological: She is alert and oriented to person, place, and time.  Skin: Skin is warm and dry.  Psychiatric: Her speech is normal and behavior is normal. Judgment and thought content normal. Cognition and memory are normal. She exhibits a depressed mood. She expresses no homicidal and no suicidal ideation.  Vitals reviewed.    Results for orders placed or performed in visit on 04/18/17  TSH  Result Value Ref Range   TSH 5.88 (H) mIU/L  Lipid panel  Result Value Ref Range   Cholesterol 169 <200 mg/dL   Triglycerides 76 <191<150 mg/dL   HDL 45 (L) >47>50 mg/dL   Total CHOL/HDL Ratio 3.8 <5.0 Ratio  VLDL 15 <30 mg/dL   LDL Cholesterol 161 (H) <100 mg/dL      Assessment & Plan:   Problem List Items Addressed This Visit      Cardiovascular and Mediastinum   Arrhythmia    Remains stable on medication.  She continues metoprolol 50 mg 24 hr tablet once daily without side effects, palpitations, or racing HR.  Continue medication without changes.      Relevant Medications   metoprolol succinate (TOPROL-XL) 50 MG 24 hr tablet     Endocrine   Hypothyroid    Stable at last lab evaluation.  No recheck, however, in last 6 months.  Will provide single refill, but pt will need lab collection to assess appropriateness of current  dose.  Refill provided.  TSH ordered.  Pt verbalizes understanding for need to recheck TSH. Followup once every 6 months.      Relevant Medications   metoprolol succinate (TOPROL-XL) 50 MG 24 hr tablet   Other Relevant Orders   TSH     Other   Depression - Primary    Slightly improved, but remains uncontrolled.  It is complicated by unresolved grief, however, pt had suboptimal control prior to her husband's death..  Pt remains unemployed and previously housewife only.   Plan: 1. Continue on fluoxetine 20 mg once daily. 2. Continue  buspirone 10 mg bid. 3. START hydroxyzine 25 mg up to tid for panic attack. 4. Reviewed importance of keeping a schedule and setting appointments.  Keep social connections.  Cautioned avoidance of family and her home as way of avoiding grief.  Continue plan for finding employment. 5. Follow-up 6-8 weeks for med management or sooner if needed.   Encouraged pt to obtain care with psychiatry for optimizing care with therapy/counseling.       Relevant Medications   FLUoxetine (PROZAC) 20 MG capsule   hydrOXYzine (ATARAX/VISTARIL) 25 MG tablet   busPIRone (BUSPAR) 10 MG tablet   Anxiety   Relevant Medications   FLUoxetine (PROZAC) 20 MG capsule   hydrOXYzine (ATARAX/VISTARIL) 25 MG tablet   busPIRone (BUSPAR) 10 MG tablet   Panic attack   Relevant Medications   FLUoxetine (PROZAC) 20 MG capsule   hydrOXYzine (ATARAX/VISTARIL) 25 MG tablet   busPIRone (BUSPAR) 10 MG tablet      Meds ordered this encounter  Medications  . FLUoxetine (PROZAC) 20 MG capsule    Sig: Take 3 capsules (60 mg total) by mouth daily.    Dispense:  90 capsule    Refill:  5    Order Specific Question:   Supervising Provider    Answer:   Smitty Cords [[id number]]  . hydrOXYzine (ATARAX/VISTARIL) 25 MG tablet    Sig: Take 0.5-1 tablets (12.5-25 mg total) by mouth 3 (three) times daily as needed for anxiety.    Dispense:  60 tablet    Refill:  2    May provide partial fill  if needed.    Order Specific Question:   Supervising Provider    Answer:   Smitty Cords [[id number]]  . busPIRone (BUSPAR) 10 MG tablet    Sig: Take 1.5 tablets (15 mg total) by mouth 2 (two) times daily.    Dispense:  90 tablet    Refill:  2    Order Specific Question:   Supervising Provider    Answer:   Smitty Cords [[id number]]  . metoprolol succinate (TOPROL-XL) 50 MG 24 hr tablet    Sig: Take 1 tablet (50 mg total) by  mouth daily. Take with or immediately following a meal.    Dispense:  30 tablet    Refill:  12    Order Specific Question:   Supervising Provider    Answer:   Smitty Cords [[id number]]    Follow up plan: Return in about 8 weeks (around 12/29/2017) for anxiety and depression.  Wilhelmina Mcardle, DNP, AGPCNP-BC Adult Gerontology Primary Care Nurse Practitioner [facility] 11/26/2017, 12:11 PM

## 2017-11-18 ENCOUNTER — Other Ambulatory Visit: Payer: Self-pay | Admitting: Nurse Practitioner

## 2017-11-18 DIAGNOSIS — E039 Hypothyroidism, unspecified: Secondary | ICD-10-CM

## 2017-11-18 NOTE — Telephone Encounter (Signed)
Must have labs prior to additional refills - Labs were ordered at last visit.  Please call pt to ensure she has this scheduled.

## 2017-11-20 ENCOUNTER — Telehealth: Payer: Self-pay

## 2017-11-20 NOTE — Telephone Encounter (Signed)
Must have labs prior to additional refills - Labs were ordered at last visit.  Please call pt to ensure she has this scheduled. Pt notified as per Leotis ShamesLauren for future refill for levothyroxine.

## 2017-11-26 ENCOUNTER — Encounter: Payer: Self-pay | Admitting: Nurse Practitioner

## 2017-11-26 DIAGNOSIS — F410 Panic disorder [episodic paroxysmal anxiety] without agoraphobia: Secondary | ICD-10-CM | POA: Insufficient documentation

## 2017-11-26 NOTE — Assessment & Plan Note (Signed)
Remains stable on medication.  She continues metoprolol 50 mg 24 hr tablet once daily without side effects, palpitations, or racing HR.  Continue medication without changes.

## 2017-11-26 NOTE — Assessment & Plan Note (Signed)
Stable at last lab evaluation.  No recheck, however, in last 6 months.  Will provide single refill, but pt will need lab collection to assess appropriateness of current dose.  Refill provided.  TSH ordered.  Pt verbalizes understanding for need to recheck TSH. Followup once every 6 months.

## 2017-11-26 NOTE — Assessment & Plan Note (Signed)
Slightly improved, but remains uncontrolled.  It is complicated by unresolved grief, however, pt had suboptimal control prior to her husband's death..  Pt remains unemployed and previously housewife only.   Plan: 1. Continue on fluoxetine 20 mg once daily. 2. Continue  buspirone 10 mg bid. 3. START hydroxyzine 25 mg up to tid for panic attack. 4. Reviewed importance of keeping a schedule and setting appointments.  Keep social connections.  Cautioned avoidance of family and her home as way of avoiding grief.  Continue plan for finding employment. 5. Follow-up 6 weeks for med management or sooner if needed.   Encouraged pt to obtain care with psychiatry for optimizing care with therapy/counseling.

## 2017-12-19 IMAGING — CR DG CHEST 2V
1 series · 2 of 2 positions shown · non-contrast
Comparison: None.

CLINICAL DATA: Chest pain with shortness of breath and cough for a
couple days. Patient denies fever, nausea and vomiting.

EXAM:
CHEST  2 VIEW

[Series 1: w chest pa · 0.14mm/px · 2 of 2 slices shown]
[im 1/2]
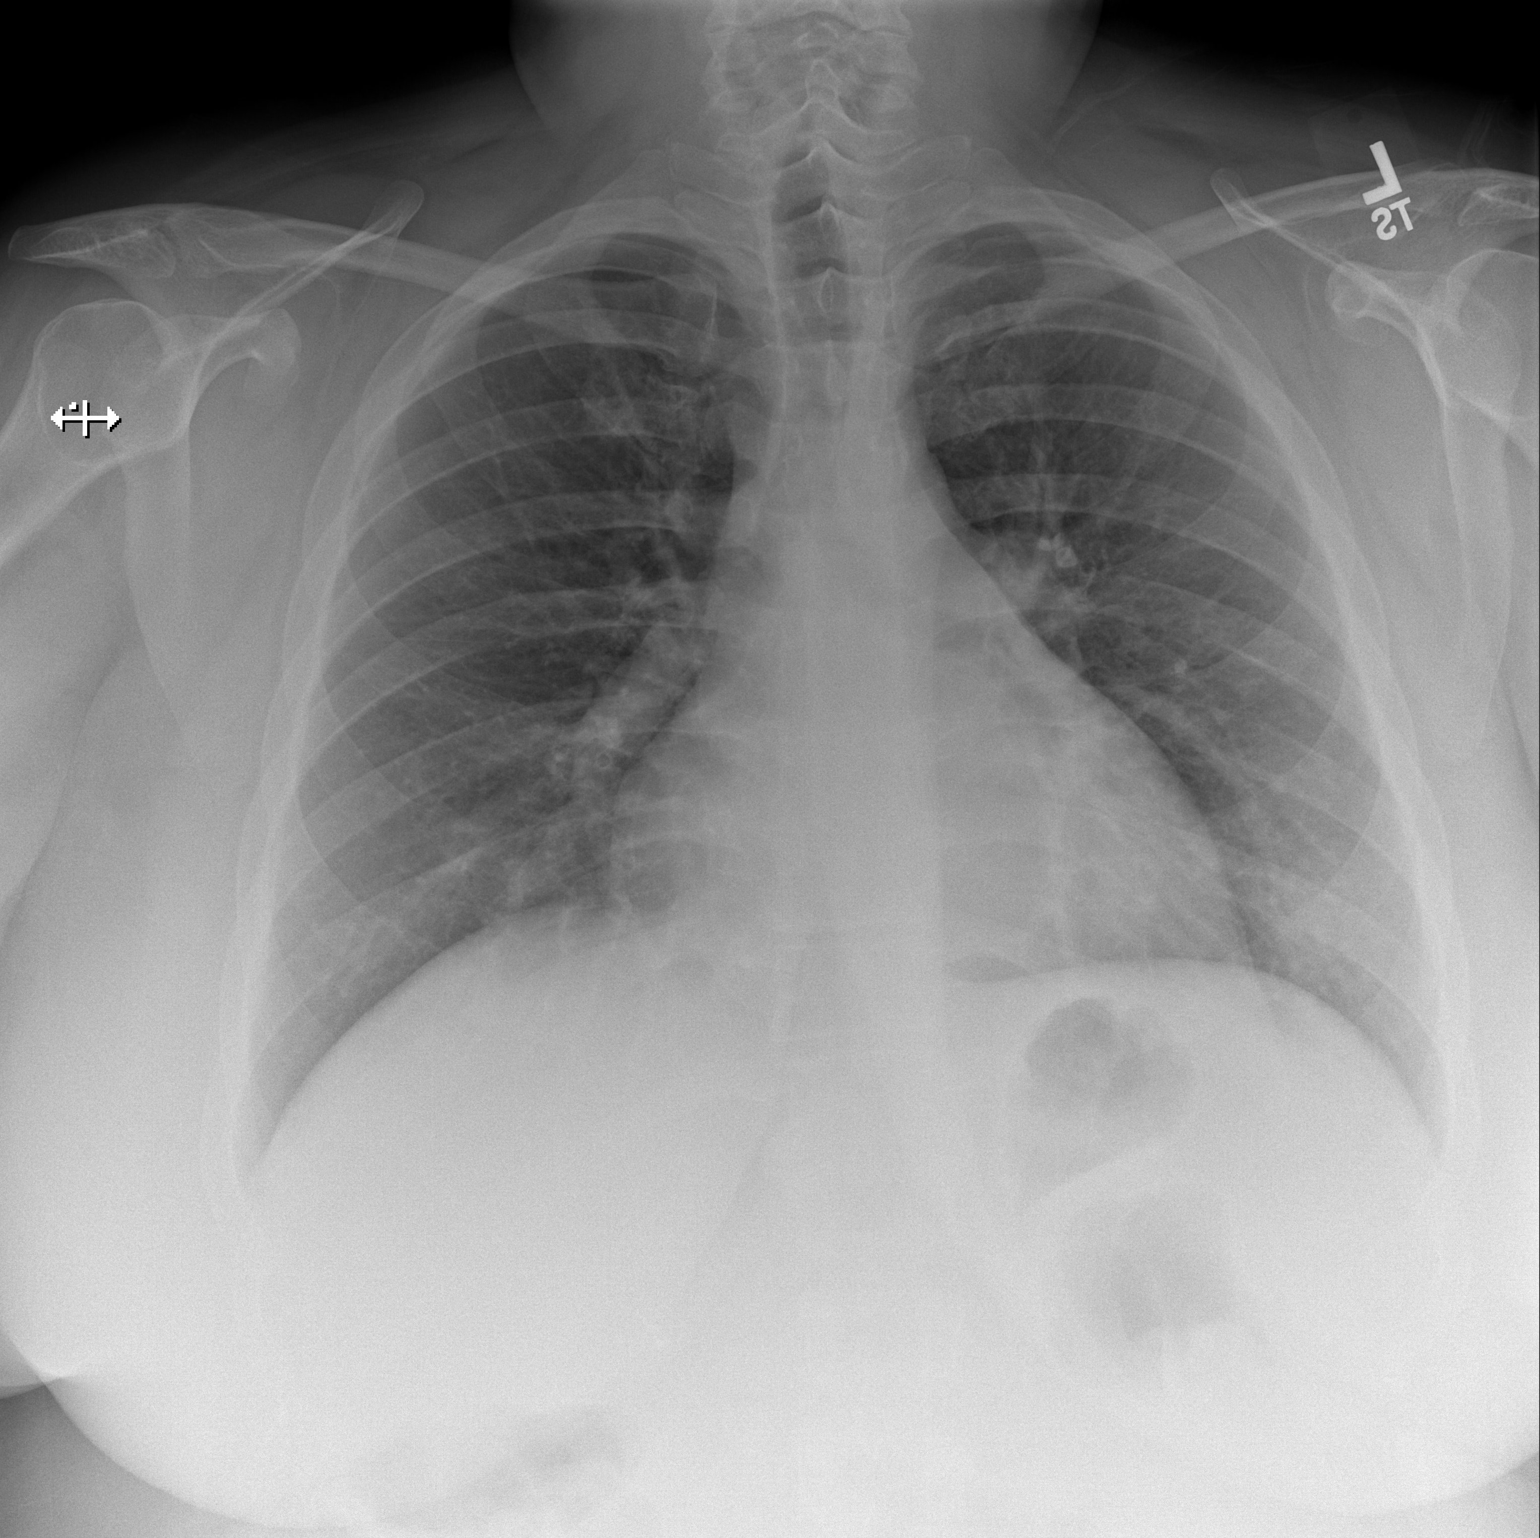
[im 2/2]
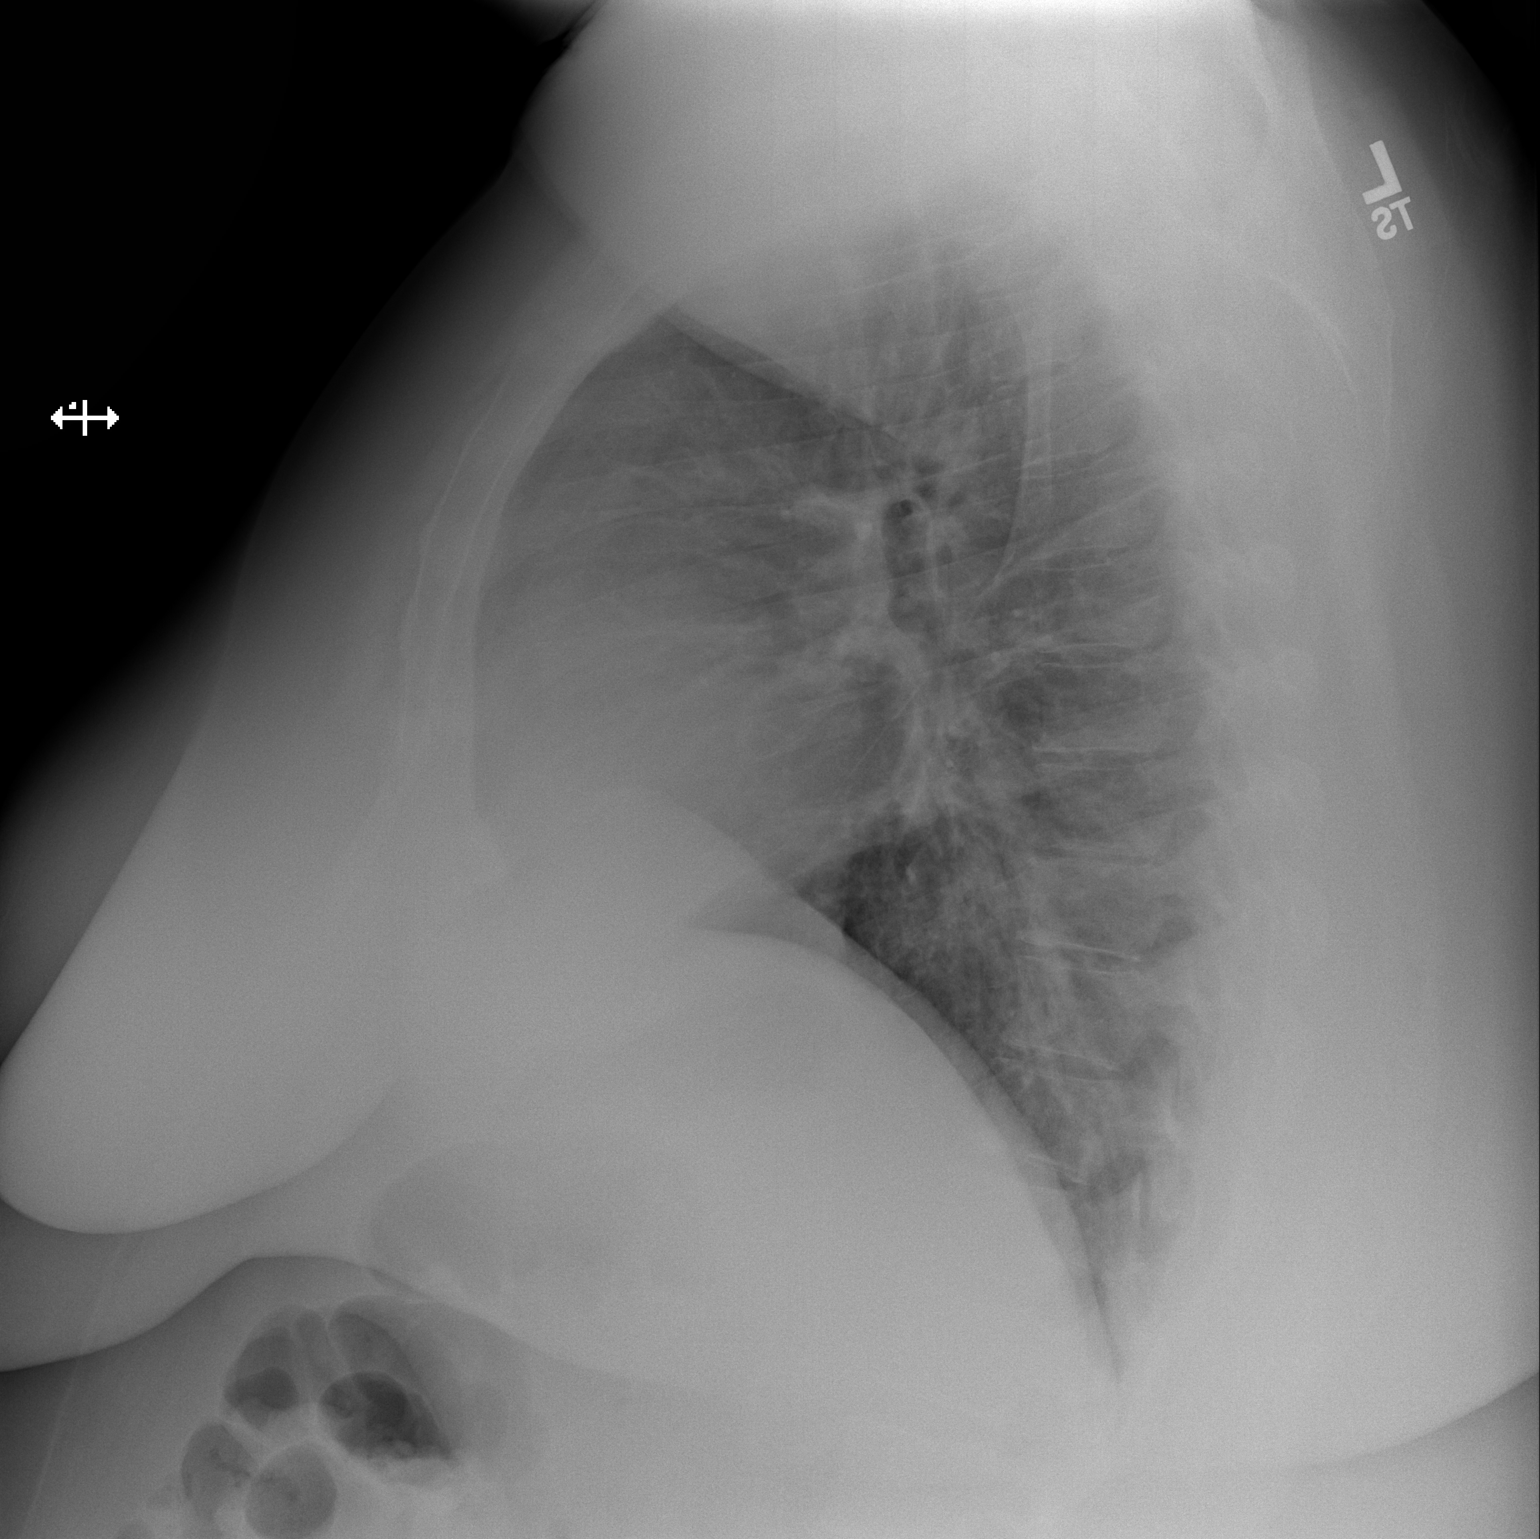

[2 of 2 positions shown; findings below may reference images not displayed]

FINDINGS: The heart size and mediastinal contours are normal. There is mild
central airway thickening without edema, confluent airspace opacity,
pleural effusion or pneumothorax. The bones appear unremarkable.
IMPRESSION: Central airway thickening suggesting bronchitis. No evidence of
pneumonia or edema.

## 2018-06-20 ENCOUNTER — Other Ambulatory Visit: Payer: Self-pay

## 2018-06-20 DIAGNOSIS — F410 Panic disorder [episodic paroxysmal anxiety] without agoraphobia: Secondary | ICD-10-CM

## 2018-06-20 DIAGNOSIS — F331 Major depressive disorder, recurrent, moderate: Secondary | ICD-10-CM

## 2018-06-20 DIAGNOSIS — F419 Anxiety disorder, unspecified: Secondary | ICD-10-CM

## 2018-06-21 MED ORDER — BUSPIRONE HCL 10 MG PO TABS: 15.0000 mg | ORAL_TABLET | Freq: Two times a day (BID) | ORAL | 1 refills | Status: DC

## 2018-07-13 ENCOUNTER — Other Ambulatory Visit: Payer: Self-pay | Admitting: Nurse Practitioner

## 2018-07-13 DIAGNOSIS — F419 Anxiety disorder, unspecified: Secondary | ICD-10-CM

## 2018-07-13 DIAGNOSIS — F331 Major depressive disorder, recurrent, moderate: Secondary | ICD-10-CM

## 2018-07-13 DIAGNOSIS — F410 Panic disorder [episodic paroxysmal anxiety] without agoraphobia: Secondary | ICD-10-CM

## 2018-07-18 ENCOUNTER — Other Ambulatory Visit: Payer: Self-pay | Admitting: Nurse Practitioner

## 2018-07-18 DIAGNOSIS — F331 Major depressive disorder, recurrent, moderate: Secondary | ICD-10-CM

## 2018-07-18 DIAGNOSIS — F410 Panic disorder [episodic paroxysmal anxiety] without agoraphobia: Secondary | ICD-10-CM

## 2018-07-18 DIAGNOSIS — F419 Anxiety disorder, unspecified: Secondary | ICD-10-CM

## 2018-09-27 ENCOUNTER — Other Ambulatory Visit: Payer: Self-pay | Admitting: Nurse Practitioner

## 2018-09-27 DIAGNOSIS — F410 Panic disorder [episodic paroxysmal anxiety] without agoraphobia: Secondary | ICD-10-CM

## 2018-09-27 DIAGNOSIS — F419 Anxiety disorder, unspecified: Secondary | ICD-10-CM

## 2018-09-27 DIAGNOSIS — F331 Major depressive disorder, recurrent, moderate: Secondary | ICD-10-CM

## 2018-12-11 ENCOUNTER — Other Ambulatory Visit: Payer: Self-pay | Admitting: Nurse Practitioner

## 2018-12-11 DIAGNOSIS — F410 Panic disorder [episodic paroxysmal anxiety] without agoraphobia: Secondary | ICD-10-CM

## 2018-12-11 DIAGNOSIS — F419 Anxiety disorder, unspecified: Secondary | ICD-10-CM

## 2018-12-11 DIAGNOSIS — F331 Major depressive disorder, recurrent, moderate: Secondary | ICD-10-CM

## 2019-04-04 ENCOUNTER — Other Ambulatory Visit: Payer: Self-pay | Admitting: Nurse Practitioner

## 2019-04-04 DIAGNOSIS — I498 Other specified cardiac arrhythmias: Secondary | ICD-10-CM

## 2023-04-13 ENCOUNTER — Other Ambulatory Visit: Payer: Self-pay

## 2023-04-17 NOTE — Progress Notes (Unsigned)
Linda Amy, PA-C [address]  [address]  [place] [address]  Main: [phone]  Fax: [phone]   Gastroenterology Consultation  Referring Provider:     Ellis Parents, FNP Primary Care Physician:  Linda Manila, NP (Inactive) Primary Gastroenterologist:  Linda Amy, PA-C / Dr. Lannette [place]   Reason for Consultation:     IBS, diarrhea, anemia        HPI:   [place] is a 47 y.o. y/o female referred for consultation & management  by Linda Manila, NP (Inactive).    [facility] [place] [facility] [place]  Here to evaluate irritable bowel syndrome with diarrhea since 2004.  Has intermittent lower abdominal cramping and urgent bowel movements after eating meals.  The symptoms come and go for many years.  She may have normal bowel movements for 2 weeks and then has flareup of loose stools.  She may have 3 or 4 loose stools daily for several weeks.  Denies rectal bleeding or constipation.  She is age 35 and is due for first screening colonoscopy.  No previous colonoscopy.  She reports seing a GI specialist once at age 36 (over 20 years ago).  No family history of colon cancer.  Her mother has Crohn's disease.  No family history of celiac.  Recent labs 01/2023 showed low vitamin D and she was started on vitamin D supplement.  Normal CMP.  CBC showed low hemoglobin 10.6, MCV 83, platelets 521.  Normocytic anemia.  He was recently started on iron daily.  TSH showed hypothyroid and she was started on levothyroxine.  Past Medical History:  Diagnosis Date   Allergy    Anxiety    Arrhythmia    Depression    GERD (gastroesophageal reflux disease)    Thyroid disease    hypo    Past Surgical History:  Procedure Laterality Date   CHOLECYSTECTOMY  2009    Prior to Admission medications   Medication Sig Start Date End Date Taking? Authorizing Provider  albuterol (PROVENTIL HFA;VENTOLIN HFA) 108 (90 Base) MCG/ACT  inhaler Inhale 1-2 puffs into the lungs every 6 (six) hours as needed for wheezing or shortness of breath. 03/20/17   Linda Manila, NP  busPIRone (BUSPAR) 10 MG tablet Take 1.5 tablets (15 mg total) by mouth 2 (two) times daily. NEED APPOINTMENT 06/21/18   Linda Manila, NP  FLUOXETINE HCL PO Take by mouth.    [provider]  hydrOXYzine (ATARAX/VISTARIL) 25 MG tablet Take 0.5-1 tablets (12.5-25 mg total) by mouth every 8 (eight) hours as needed for anxiety. NEED APPOINTMENT 07/18/18   Linda Manila, NP  ibuprofen (ADVIL) 100 MG tablet Take by mouth. 03/13/14   [provider]  levothyroxine (SYNTHROID, LEVOTHROID) 50 MCG tablet TAKE 1 TABLET (50 MCG TOTAL) BY MOUTH DAILY. 11/18/17   Linda Manila, NP  omeprazole (PRILOSEC) 40 MG capsule Take by mouth.    [provider]    Family History  Problem Relation Age of Onset   Cancer Mother        basal cell (> 1 occurence)   Depression Mother    Cancer Father        renal   Drug abuse Father    Mental illness Father    Depression Father    Mental illness Brother        substance abuse   Heart attack Maternal Grandmother    Prostate cancer Maternal Grandfather  Depression Maternal Grandfather    Mental illness Maternal Grandfather    Depression Paternal Grandmother    Heart attack Paternal Grandfather      Social History   Tobacco Use   Smoking status: Never   Smokeless tobacco: Never   Tobacco comments:    second hand smoke exposure in home as child,  and adult none last 15 years  Substance Use Topics   Alcohol use: No   Drug use: No    Allergies as of 04/18/2023   (No Known Allergies)    Review of Systems:    All systems reviewed and negative except where noted in HPI.   Physical Exam:  BP 134/83   Pulse 69   Temp 97.9 F (36.6 C)   Ht 5\' 5"  (1.651 m)   Wt (!) 330 lb 6.4 oz (149.9 kg)   LMP 03/03/2023   BMI 54.98 kg/m  Patient's last menstrual period was  03/03/2023.  General:   Alert,  Well-developed, obese, pleasant and cooperative in NAD Lungs:  Respirations even and unlabored.  Clear throughout to auscultation.   No wheezes, crackles, or rhonchi. No acute distress. Heart:  Regular rate and rhythm; no murmurs, clicks, rubs, or gallops. Abdomen:  Normal bowel sounds.  No bruits.  Soft, and obese without masses, hepatosplenomegaly or hernias noted.  There is mild generalized upper and lower abdominal Tenderness throughout.  No guarding or rebound tenderness.    Neurologic:  Alert and oriented x3;  grossly normal neurologically. Psych:  Alert and cooperative. Normal mood and affect.  Imaging Studies: No results found.  Assessment and Plan:   [place] is a 47 y.o. y/o female has been referred for:  1.  Microcytic anemia  Labs: CBC, iron panel, ferritin, B12, folate, and celiac lab.   Scheduling EGD I discussed risks of EGD with patient to include risk of bleeding, perforation, and risk of sedation.  Patient expressed understanding and agrees to proceed with EGD.   2.  Chronic diarrhea  Celiac lab  Colonoscopy  If GI tests are unrevealing, and diarrhea persists, consider Cholestyramine or Colestid for possible bile salt diarrhea post cholecystectomy.  3.  Irritable bowel syndrome, diarrhea predominant (since 2004)  Try Low FODMAP diet - Handout Given.  Rx: Dicyclomine 10mg  1 tab TID before meals prn.  Xifaxan in the future if symptoms worsen.  4.  Colon cancer screening; age 64 and is due for first screening colonoscopy.  No previous colonoscopy.  Scheduling colonoscopy  Follow up 4 weeks after EGD and colonoscopy procedures with TG.  [place] Amy, PA-C

## 2023-04-18 ENCOUNTER — Ambulatory Visit: Payer: Medicaid Other | Admitting: Physician Assistant

## 2023-04-18 ENCOUNTER — Encounter: Payer: Self-pay | Admitting: Physician Assistant

## 2023-04-18 VITALS — BP 134/83 | HR 69 | Temp 97.9°F | Ht 65.0 in | Wt 330.4 lb

## 2023-04-18 DIAGNOSIS — D649 Anemia, unspecified: Secondary | ICD-10-CM

## 2023-04-18 DIAGNOSIS — D509 Iron deficiency anemia, unspecified: Secondary | ICD-10-CM | POA: Diagnosis not present

## 2023-04-18 DIAGNOSIS — K580 Irritable bowel syndrome with diarrhea: Secondary | ICD-10-CM | POA: Diagnosis not present

## 2023-04-18 DIAGNOSIS — Z1211 Encounter for screening for malignant neoplasm of colon: Secondary | ICD-10-CM

## 2023-04-18 DIAGNOSIS — R197 Diarrhea, unspecified: Secondary | ICD-10-CM

## 2023-04-18 MED ORDER — DICYCLOMINE HCL 10 MG PO CAPS: 10.0000 mg | ORAL_CAPSULE | Freq: Three times a day (TID) | ORAL | 2 refills | Status: DC

## 2023-04-18 MED ORDER — PEG 3350-KCL-NA BICARB-NACL 420 G PO SOLR: 4000.0000 mL | Freq: Once | ORAL | 0 refills | Status: AC

## 2023-04-21 LAB — IRON,TIBC AND FERRITIN PANEL
Ferritin: 29 ng/mL (ref 15–150)
Iron Saturation: 12 % — ABNORMAL LOW (ref 15–55)
Iron: 43 ug/dL (ref 27–159)
Total Iron Binding Capacity: 352 ug/dL (ref 250–450)
UIBC: 309 ug/dL (ref 131–425)

## 2023-04-21 LAB — CBC
Hematocrit: 36.8 % (ref 34.0–46.6)
Hemoglobin: 11.2 g/dL (ref 11.1–15.9)
MCH: 24.5 pg — ABNORMAL LOW (ref 26.6–33.0)
MCHC: 30.4 g/dL — ABNORMAL LOW (ref 31.5–35.7)
MCV: 81 fL (ref 79–97)
Platelets: 530 10*3/uL — ABNORMAL HIGH (ref 150–450)
RBC: 4.57 x10E6/uL (ref 3.77–5.28)
RDW: 14.8 % (ref 11.7–15.4)
WBC: 7.5 10*3/uL (ref 3.4–10.8)

## 2023-04-21 LAB — B12 AND FOLATE PANEL
Folate: 4.6 ng/mL (ref >3.0)
Vitamin B-12: 304 pg/mL (ref 232–1245)

## 2023-04-21 LAB — CELIAC DISEASE AB SCREEN W/RFX
Antigliadin Abs, IgA: 5 U (ref 0–19)
IgA/Immunoglobulin A, Serum: 396 mg/dL — ABNORMAL HIGH (ref 87–352)
Transglutaminase IgA: <2 U/mL (ref 0–3)

## 2023-05-26 ENCOUNTER — Telehealth: Payer: Self-pay | Admitting: Oncology

## 2023-05-26 NOTE — Telephone Encounter (Signed)
05/26/23 Spoke with patient and scheduled New Hematology appt.

## 2023-05-30 ENCOUNTER — Other Ambulatory Visit: Payer: Self-pay | Admitting: Oncology

## 2023-05-30 DIAGNOSIS — D508 Other iron deficiency anemias: Secondary | ICD-10-CM

## 2023-05-30 NOTE — Progress Notes (Unsigned)
[facility]  [address] [place],  [place]  [address] [phone]  Clinic Day:  05/31/2023  Referring physician: Ellis Parents, FNP   HISTORY OF PRESENT ILLNESS:  The patient is a 47 y.o. female  who I was asked to consult upon due to having a low iron saturation.  Recent labs showed a hemoglobin of 11.2, with an MCV of 84.  Iron studies done recently showed a ferritin of 34, a serum iron of 45, a TIBC of 347, and a low iron saturation of 13%.  According to the patient, she has had anemia for at least the past year.  For the past 5 months, she has been taking 1 iron pill daily.  She claims her menstrual cycles can intermittently be very heavy, where they last for as long as 7 days at a time, with thick clots being seen within them.  The patient claims she did have a Pap smear/pelvic exam within the past few weeks, which came back unremarkable.  She denies having other overt forms of blood loss.  Of note, she is scheduled for a colonoscopy in 2 weeks.  PAST MEDICAL HISTORY:   Past Medical History:  Diagnosis Date   Allergy    Anxiety    Arrhythmia    Depression    GERD (gastroesophageal reflux disease)    Thyroid disease    hypo    PAST SURGICAL HISTORY:   Past Surgical History:  Procedure Laterality Date   CHOLECYSTECTOMY  2009    CURRENT MEDICATIONS:   Current Outpatient Medications  Medication Sig Dispense Refill   albuterol (PROVENTIL HFA;VENTOLIN HFA) 108 (90 Base) MCG/ACT inhaler Inhale 1-2 puffs into the lungs every 6 (six) hours as needed for wheezing or shortness of breath. 1 Inhaler 5   dicyclomine (BENTYL) 10 MG capsule Take 1 capsule (10 mg total) by mouth 3 (three) times daily before meals. 90 capsule 2   FLUOXETINE HCL PO Take by mouth.     hydrOXYzine (ATARAX/VISTARIL) 25 MG tablet Take 0.5-1 tablets (12.5-25 mg total) by mouth every 8 (eight) hours as needed for anxiety. NEED APPOINTMENT 30 tablet 0   ibuprofen (ADVIL)  100 MG tablet Take by mouth.     levothyroxine (SYNTHROID, LEVOTHROID) 50 MCG tablet TAKE 1 TABLET (50 MCG TOTAL) BY MOUTH DAILY. 30 tablet 0   omeprazole (PRILOSEC) 40 MG capsule Take by mouth.     No current facility-administered medications for this visit.   ALLERGIES:  No Known Allergies  FAMILY HISTORY:   Family History  Problem Relation Age of Onset   Cancer Mother        basal cell (> 1 occurence)   Depression Mother    Cancer Father        renal   Drug abuse Father        PASSED AWAY FROM DRUG OVERDOSE   Mental illness Father    Depression Father    Mental illness Brother        PASSED AWAY FROM DRUG OVERDOSE   Heart attack Maternal Grandmother    Prostate cancer Maternal Grandfather    Depression Maternal Grandfather    Mental illness Maternal Grandfather    Depression Paternal Grandmother    Heart attack Paternal Grandfather    Breast cancer Maternal Aunt     SOCIAL HISTORY:  The patient was born and raised in [place].  She currently lives in [place].  She is widowed, with 1 child.  She  is unemployed.  There is no history of alcoholism or tobacco abuse.  REVIEW OF SYSTEMS:  Review of Systems  Constitutional:  Negative for fatigue and fever.  HENT:   Negative for hearing loss and sore throat.   Eyes:  Negative for eye problems.  Respiratory:  Negative for chest tightness, cough and hemoptysis.   Cardiovascular:  Positive for palpitations. Negative for chest pain.  Gastrointestinal:  Positive for diarrhea and nausea. Negative for abdominal distention, abdominal pain, blood in stool, constipation and vomiting.  Endocrine: Negative for hot flashes.  Genitourinary:  Negative for difficulty urinating, dysuria, frequency, hematuria and nocturia.   Musculoskeletal:  Negative for arthralgias, back pain, gait problem and myalgias.  Skin: Negative.  Negative for itching and rash.  Neurological: Negative.  Negative for dizziness, extremity weakness, gait problem,  headaches, light-headedness and numbness.  Hematological: Negative.   Psychiatric/Behavioral:  Positive for decreased concentration. Negative for depression and suicidal ideas. The patient is nervous/anxious.      PHYSICAL EXAM:  Blood pressure (!) 188/97, pulse 68, temperature 97.9 F (36.6 C), resp. rate 16, height 5\' 5"  (1.651 m), weight (!) 329 lb 14.4 oz (149.6 kg), SpO2 99%. Wt Readings from Last 3 Encounters:  05/31/23 (!) 329 lb 14.4 oz (149.6 kg)  04/18/23 (!) 330 lb 6.4 oz (149.9 kg)  11/03/17 (!) 325 lb 6.4 oz (147.6 kg)   Body mass index is 54.9 kg/m. Performance status (ECOG): 0 - Asymptomatic Physical Exam Constitutional:      Appearance: Normal appearance. She is obese. She is not ill-appearing.  HENT:     Mouth/Throat:     Mouth: Mucous membranes are moist.     Pharynx: Oropharynx is clear. No oropharyngeal exudate or posterior oropharyngeal erythema.  Cardiovascular:     Rate and Rhythm: Normal rate and regular rhythm.     Heart sounds: No murmur heard.    No friction rub. No gallop.  Pulmonary:     Effort: Pulmonary effort is normal. No respiratory distress.     Breath sounds: Normal breath sounds. No wheezing, rhonchi or rales.  Abdominal:     General: Bowel sounds are normal. There is no distension.     Palpations: Abdomen is soft. There is no mass.     Tenderness: There is no abdominal tenderness.  Musculoskeletal:        General: No swelling.     Right lower leg: No edema.     Left lower leg: No edema.  Lymphadenopathy:     Cervical: No cervical adenopathy.     Upper Body:     Right upper body: No supraclavicular or axillary adenopathy.     Left upper body: No supraclavicular or axillary adenopathy.     Lower Body: No right inguinal adenopathy. No left inguinal adenopathy.  Skin:    General: Skin is warm.     Coloration: Skin is not jaundiced.     Findings: No lesion or rash.  Neurological:     General: No focal deficit present.     Mental  Status: She is alert and oriented to person, place, and time. Mental status is at baseline.  Psychiatric:        Mood and Affect: Mood normal.        Behavior: Behavior normal.        Thought Content: Thought content normal.    LABS:      Latest Ref Rng & Units 04/18/2023   10:16 AM 12/02/2016   12:01 AM 01/13/2016  6:31 PM  CBC  WBC 3.4 - 10.8 x10E3/uL 7.5  8.4  6.2   Hemoglobin 11.1 - 15.9 g/dL 16.1  09.6  04.5   Hematocrit 34.0 - 46.6 % 36.8  37.9  35.6   Platelets 150 - 450 x10E3/uL 530  437  365       Latest Ref Rng & Units 01/13/2016    6:31 PM  CMP  Glucose 65 - 99 mg/dL 83   BUN 6 - 20 mg/dL 7   Creatinine 4.09 - 8.11 mg/dL 9.14   Sodium 782 - 956 mmol/L 138   Potassium 3.5 - 5.1 mmol/L 3.2   Chloride 101 - 111 mmol/L 106   CO2 22 - 32 mmol/L 25   Calcium 8.9 - 10.3 mg/dL 8.2     Latest Reference Range & Units 05/31/23 10:00  Iron 28 - 170 ug/dL 43  UIBC ug/dL 213  TIBC 086 - 578 ug/dL 469  Saturation Ratios 10.4 - 31.8 % 11  Ferritin 11 - 307 ng/mL 11    ASSESSMENT & PLAN:  A 47 y.o. female who I was asked to consult upon for iron deficiency anemia.  When evaluating her labs today, essentially all of her iron parameters are on the very low end of normal.  This is despite her taking oral iron for at least the past 5 months.  Based upon this, I will arrange for her to receive IV iron over these next few weeks to rapidly replenish her iron stores and normalize her hemoglobin.  She knows to keep her colonoscopy appointment in the forthcoming weeks.  I will see her back in 3 months to reassess her iron and hemoglobin levels to see how well she responded to her upcoming IV iron.  The patient understands all the plans discussed today and is in agreement with them.  I do appreciate Ellis Parents, FNP for his new consult.   Jinelle Butchko Kirby Funk, MD

## 2023-05-31 ENCOUNTER — Inpatient Hospital Stay: Payer: Medicaid Other | Admitting: Oncology

## 2023-05-31 ENCOUNTER — Inpatient Hospital Stay: Payer: Medicaid Other | Attending: Oncology

## 2023-05-31 ENCOUNTER — Encounter: Payer: Self-pay | Admitting: Oncology

## 2023-05-31 ENCOUNTER — Inpatient Hospital Stay: Payer: Medicaid Other

## 2023-05-31 ENCOUNTER — Other Ambulatory Visit: Payer: Self-pay | Admitting: Oncology

## 2023-05-31 VITALS — BP 188/97 | HR 68 | Temp 97.9°F | Resp 16 | Ht 65.0 in | Wt 329.9 lb

## 2023-05-31 DIAGNOSIS — D509 Iron deficiency anemia, unspecified: Secondary | ICD-10-CM

## 2023-05-31 DIAGNOSIS — D500 Iron deficiency anemia secondary to blood loss (chronic): Secondary | ICD-10-CM

## 2023-05-31 DIAGNOSIS — D508 Other iron deficiency anemias: Secondary | ICD-10-CM

## 2023-05-31 LAB — IRON AND TIBC
Iron: 43 ug/dL (ref 28–170)
Saturation Ratios: 11 % (ref 10.4–31.8)
TIBC: 396 ug/dL (ref 250–450)
UIBC: 353 ug/dL

## 2023-05-31 LAB — FERRITIN: Ferritin: 11 ng/mL (ref 11–307)

## 2023-05-31 NOTE — Progress Notes (Signed)
[facility] Clinical Social Work  Clinical Social Work was referred by nurse for assessment of psychosocial needs.  Clinical Social Worker contacted patient by phone to offer support and assess for needs.  Patient expressed current depressive (managed by medication) and anxiety symptoms. Patient has upcoming appointment with Psychiatric Provider in [place] to discuss meditation management for reported concerns. CSW provided information on traditional options to treat presenting symptoms (med alone, med and therapy, and therapy). Patient expressed interest in receiving therapy from a community provider. CSW and patient discussed needs and preferences. CSW will locate providers and follow up with patient.   Marguerita Merles, LCSWA Clinical Social Worker [facility]

## 2023-06-01 ENCOUNTER — Telehealth: Payer: Self-pay | Admitting: Oncology

## 2023-06-01 LAB — SOLUBLE TRANSFERRIN RECEPTOR: Transferrin Receptor: 18.2 nmol/L (ref 12.2–27.3)

## 2023-06-01 NOTE — Telephone Encounter (Signed)
Patient has been scheduled. Aware of appt date and time.   Scheduling Message Entered by Rennis Harding A on 05/31/2023 at  3:15 PM Priority: Routine <No visit type provided>  Department: [facility]  Provider:  Scheduling Notes:  Iv iron later this month  Labs/appt 08-31-23

## 2023-06-06 ENCOUNTER — Telehealth: Payer: Self-pay

## 2023-06-06 NOTE — Telephone Encounter (Signed)
[facility] CSW Progress Note  Clinical Child psychotherapist contacted patient by phone to receive verbal authorization to email list of local outpatient therapy providers. CSW received verbal auth and confirmed email address and sent recommendations.  Marguerita Merles, LCSWA Clinical Social Worker [facility]

## 2023-06-12 ENCOUNTER — Encounter: Payer: Self-pay | Admitting: Gastroenterology

## 2023-06-13 ENCOUNTER — Ambulatory Visit: Payer: Medicaid Other | Admitting: Anesthesiology

## 2023-06-13 ENCOUNTER — Other Ambulatory Visit: Payer: Self-pay

## 2023-06-13 ENCOUNTER — Ambulatory Visit
Admission: RE | Admit: 2023-06-13 | Discharge: 2023-06-13 | Disposition: A | Discharge status: Home / Self Care | Payer: Medicaid Other | Attending: Gastroenterology | Admitting: Gastroenterology

## 2023-06-13 ENCOUNTER — Encounter: Payer: Self-pay | Admitting: Gastroenterology

## 2023-06-13 ENCOUNTER — Encounter
Admission: RE | Disposition: A | Discharge status: Home / Self Care | Payer: Self-pay | Source: Home / Self Care | Attending: Gastroenterology

## 2023-06-13 DIAGNOSIS — D124 Benign neoplasm of descending colon: Secondary | ICD-10-CM | POA: Insufficient documentation

## 2023-06-13 DIAGNOSIS — D509 Iron deficiency anemia, unspecified: Secondary | ICD-10-CM

## 2023-06-13 DIAGNOSIS — K2950 Unspecified chronic gastritis without bleeding: Secondary | ICD-10-CM

## 2023-06-13 DIAGNOSIS — K635 Polyp of colon: Secondary | ICD-10-CM | POA: Insufficient documentation

## 2023-06-13 DIAGNOSIS — E039 Hypothyroidism, unspecified: Secondary | ICD-10-CM | POA: Insufficient documentation

## 2023-06-13 DIAGNOSIS — G4730 Sleep apnea, unspecified: Secondary | ICD-10-CM | POA: Insufficient documentation

## 2023-06-13 DIAGNOSIS — K319 Disease of stomach and duodenum, unspecified: Secondary | ICD-10-CM | POA: Diagnosis not present

## 2023-06-13 DIAGNOSIS — R197 Diarrhea, unspecified: Secondary | ICD-10-CM | POA: Diagnosis present

## 2023-06-13 DIAGNOSIS — K317 Polyp of stomach and duodenum: Secondary | ICD-10-CM | POA: Diagnosis not present

## 2023-06-13 DIAGNOSIS — K219 Gastro-esophageal reflux disease without esophagitis: Secondary | ICD-10-CM | POA: Insufficient documentation

## 2023-06-13 DIAGNOSIS — K3189 Other diseases of stomach and duodenum: Secondary | ICD-10-CM | POA: Insufficient documentation

## 2023-06-13 DIAGNOSIS — Z6841 Body Mass Index (BMI) 40.0 and over, adult: Secondary | ICD-10-CM | POA: Diagnosis not present

## 2023-06-13 DIAGNOSIS — D126 Benign neoplasm of colon, unspecified: Secondary | ICD-10-CM | POA: Diagnosis not present

## 2023-06-13 DIAGNOSIS — Z87891 Personal history of nicotine dependence: Secondary | ICD-10-CM | POA: Diagnosis not present

## 2023-06-13 DIAGNOSIS — Z1211 Encounter for screening for malignant neoplasm of colon: Secondary | ICD-10-CM

## 2023-06-13 DIAGNOSIS — F32A Depression, unspecified: Secondary | ICD-10-CM | POA: Insufficient documentation

## 2023-06-13 DIAGNOSIS — D649 Anemia, unspecified: Secondary | ICD-10-CM

## 2023-06-13 DIAGNOSIS — K529 Noninfective gastroenteritis and colitis, unspecified: Secondary | ICD-10-CM | POA: Insufficient documentation

## 2023-06-13 DIAGNOSIS — F419 Anxiety disorder, unspecified: Secondary | ICD-10-CM | POA: Diagnosis not present

## 2023-06-13 HISTORY — PX: HEMOSTASIS CLIP PLACEMENT: SHX6857

## 2023-06-13 HISTORY — PX: BIOPSY: SHX5522

## 2023-06-13 HISTORY — PX: COLONOSCOPY WITH PROPOFOL: SHX5780

## 2023-06-13 HISTORY — PX: ESOPHAGOGASTRODUODENOSCOPY (EGD) WITH PROPOFOL: SHX5813

## 2023-06-13 HISTORY — PX: POLYPECTOMY: SHX5525

## 2023-06-13 HISTORY — DX: Iron deficiency anemia, unspecified: D50.9

## 2023-06-13 LAB — POCT PREGNANCY, URINE: Preg Test, Ur: NEGATIVE

## 2023-06-13 SURGERY — COLONOSCOPY WITH PROPOFOL
Anesthesia: General

## 2023-06-13 MED ORDER — SODIUM CHLORIDE 0.9 % IV SOLN: INTRAVENOUS | Status: DC

## 2023-06-13 MED ORDER — PROPOFOL 1000 MG/100ML IV EMUL
INTRAVENOUS | Status: AC
  Filled 2023-06-13: qty 100

## 2023-06-13 MED ORDER — DEXMEDETOMIDINE HCL IN NACL 80 MCG/20ML IV SOLN
INTRAVENOUS | Status: DC | PRN
Start: 2023-06-13 — End: 2023-06-13
  Administered 2023-06-13: 8 ug via INTRAVENOUS

## 2023-06-13 MED ORDER — LIDOCAINE HCL (CARDIAC) PF 100 MG/5ML IV SOSY
PREFILLED_SYRINGE | INTRAVENOUS | Status: DC | PRN
Start: 2023-06-13 — End: 2023-06-13
  Administered 2023-06-13: 50 mg via INTRAVENOUS

## 2023-06-13 MED ORDER — PROPOFOL 10 MG/ML IV BOLUS
INTRAVENOUS | Status: DC | PRN
Start: 2023-06-13 — End: 2023-06-13
  Administered 2023-06-13: 80 mg via INTRAVENOUS
  Administered 2023-06-13: 20 mg via INTRAVENOUS
  Administered 2023-06-13: 100 ug/kg/min via INTRAVENOUS
  Administered 2023-06-13: 50 mg via INTRAVENOUS

## 2023-06-13 NOTE — Transfer of Care (Signed)
Immediate Anesthesia Transfer of Care Note  Patient: Linda Bell  Procedure(s) Performed: COLONOSCOPY WITH PROPOFOL ESOPHAGOGASTRODUODENOSCOPY (EGD) WITH PROPOFOL BIOPSY POLYPECTOMY HEMOSTASIS CLIP PLACEMENT  Patient Location: Endoscopy Unit  Anesthesia Type:General  Level of Consciousness: awake, alert , and oriented  Airway & Oxygen Therapy: Patient Spontanous Breathing  Post-op Assessment: Report given to RN and Post -op Vital signs reviewed and stable  Post vital signs: Reviewed and stable  Last Vitals:  Vitals Value Taken Time  BP 115/80 06/13/23 0842  Temp    Pulse 70 06/13/23 0842  Resp 17 06/13/23 0842  SpO2 98 % 06/13/23 0842  Vitals shown include unfiled device data.  Last Pain:  Vitals:   06/13/23 0703  TempSrc: Temporal  PainSc: 0-No pain         Complications: No notable events documented.

## 2023-06-13 NOTE — Anesthesia Preprocedure Evaluation (Signed)
Anesthesia Evaluation  Patient identified by MRN, date of birth, ID band Patient awake  General Assessment Comment:  Patient was told "they had trouble with my breathing after gallbladder surgery" over a decade ago. Patient notes she was similarly obese at that time.  Reviewed: Allergy & Precautions, NPO status , Patient's Chart, lab work & pertinent test results  History of Anesthesia Complications Negative for: history of anesthetic complications  Airway Mallampati: III  TM Distance: <3 FB Neck ROM: Full    Dental no notable dental hx. (+) Teeth Intact   Pulmonary sleep apnea , neg COPD, Patient abstained from smoking.Not current smoker Likely undiagnosed OSA - her partner notes she snores and stops breathing at night. I educated patient of importance of getting it worked up with her PCP.   Pulmonary exam normal breath sounds clear to auscultation       Cardiovascular Exercise Tolerance: Good METS(-) hypertension(-) CAD and (-) Past MI negative cardio ROS (-) dysrhythmias  Rhythm:Regular Rate:Normal - Systolic murmurs    Neuro/Psych  PSYCHIATRIC DISORDERS Anxiety Depression    negative neurological ROS     GI/Hepatic ,GERD  Controlled,,(+)     (-) substance abuse    Endo/Other  neg diabetesHypothyroidism  Morbid obesity  Renal/GU negative Renal ROS     Musculoskeletal   Abdominal  (+) + obese  Peds  Hematology  (+) Blood dyscrasia, anemia   Anesthesia Other Findings Past Medical History: No date: Allergy No date: Anxiety No date: Arrhythmia No date: Depression No date: GERD (gastroesophageal reflux disease) No date: Thyroid disease     Comment:  hypo  Reproductive/Obstetrics                             Anesthesia Physical Anesthesia Plan  ASA: 3  Anesthesia Plan: General   Post-op Pain Management: Minimal or no pain anticipated   Induction: Intravenous  PONV Risk Score  and Plan: 3 and Propofol infusion, TIVA and Ondansetron  Airway Management Planned: Nasal CPAP and Natural Airway  Additional Equipment: None  Intra-op Plan:   Post-operative Plan:   Informed Consent: I have reviewed the patients History and Physical, chart, labs and discussed the procedure including the risks, benefits and alternatives for the proposed anesthesia with the patient or authorized representative who has indicated his/her understanding and acceptance.     Dental advisory given  Plan Discussed with: CRNA and Surgeon  Anesthesia Plan Comments: (Discussed risks of anesthesia with patient, including possibility of difficulty with spontaneous ventilation under anesthesia necessitating airway intervention, PONV, and rare risks such as cardiac or respiratory or neurological events, and allergic reactions. Discussed the role of CRNA in patient's perioperative care. Patient understands. Patient informed about increased incidence of above perioperative risk due to high BMI. Patient understands.  )       Anesthesia Quick Evaluation

## 2023-06-13 NOTE — Anesthesia Postprocedure Evaluation (Signed)
Anesthesia Post Note  Patient: Celena Lanius  Procedure(s) Performed: COLONOSCOPY WITH PROPOFOL ESOPHAGOGASTRODUODENOSCOPY (EGD) WITH PROPOFOL BIOPSY POLYPECTOMY HEMOSTASIS CLIP PLACEMENT  Patient location during evaluation: Endoscopy Anesthesia Type: General Level of consciousness: awake and alert Pain management: pain level controlled Vital Signs Assessment: post-procedure vital signs reviewed and stable Respiratory status: spontaneous breathing, nonlabored ventilation, respiratory function stable and patient connected to nasal cannula oxygen Cardiovascular status: blood pressure returned to baseline and stable Postop Assessment: no apparent nausea or vomiting Anesthetic complications: no  No notable events documented.   Last Vitals:  Vitals:   06/13/23 0852 06/13/23 0902  BP: (!) 121/95 138/88  Pulse:    Resp:    Temp:    SpO2:      Last Pain:  Vitals:   06/13/23 0847  TempSrc:   PainSc: 1                  Corinda Gubler

## 2023-06-13 NOTE — H&P (Signed)
Arlyss Repress, MD [address]  [address]  [place], [place] [address]  Main: [phone]  Fax: [phone] Pager: [phone]  Primary Care Physician:  Ellis Parents, FNP Primary Gastroenterologist:  Dr. Arlyss Repress  Pre-Procedure History & Physical: HPI:  Linda Bell is a 47 y.o. female is here for an endoscopy and colonoscopy.   Past Medical History:  Diagnosis Date   Allergy    Anxiety    Arrhythmia    Depression    GERD (gastroesophageal reflux disease)    Thyroid disease    hypo    Past Surgical History:  Procedure Laterality Date   CHOLECYSTECTOMY  2009    Prior to Admission medications   Medication Sig Start Date End Date Taking? Authorizing Provider  dicyclomine (BENTYL) 10 MG capsule Take 1 capsule (10 mg total) by mouth 3 (three) times daily before meals. 04/18/23 07/17/23 Yes Celso Amy, PA-C  FLUOXETINE HCL PO Take by mouth.   Yes [provider]  hydrOXYzine (ATARAX/VISTARIL) 25 MG tablet Take 0.5-1 tablets (12.5-25 mg total) by mouth every 8 (eight) hours as needed for anxiety. NEED APPOINTMENT 07/18/18  Yes Galen Manila, NP  levothyroxine (SYNTHROID, LEVOTHROID) 50 MCG tablet TAKE 1 TABLET (50 MCG TOTAL) BY MOUTH DAILY. 11/18/17  Yes Galen Manila, NP  omeprazole (PRILOSEC) 40 MG capsule Take by mouth.   Yes [provider]  albuterol (PROVENTIL HFA;VENTOLIN HFA) 108 (90 Base) MCG/ACT inhaler Inhale 1-2 puffs into the lungs every 6 (six) hours as needed for wheezing or shortness of breath. 03/20/17   Galen Manila, NP  ibuprofen (ADVIL) 100 MG tablet Take by mouth. 03/13/14   [provider]    Allergies as of 04/18/2023   (No Known Allergies)    Family History  Problem Relation Age of Onset   Cancer Mother        basal cell (> 1 occurence)   Depression Mother    Cancer Father        renal   Drug abuse Father        PASSED AWAY FROM DRUG OVERDOSE   Mental illness Father     Depression Father    Mental illness Brother        PASSED AWAY FROM DRUG OVERDOSE   Heart attack Maternal Grandmother    Prostate cancer Maternal Grandfather    Depression Maternal Grandfather    Mental illness Maternal Grandfather    Depression Paternal Grandmother    Heart attack Paternal Grandfather    Breast cancer Maternal Aunt     Social History   Socioeconomic History   Marital status: Widowed    Spouse name: Not on file   Number of children: 1   Years of education: 12 + 2   Highest education level: Not on file  Occupational History   Occupation: STAY AT HOME MOM  Tobacco Use   Smoking status: Never   Smokeless tobacco: Never   Tobacco comments:    second hand smoke exposure in home as child,  and adult none last 15 years  Vaping Use   Vaping status: Former  Substance and Sexual Activity   Alcohol use: No   Drug use: No   Sexual activity: Yes    Birth control/protection: None  Other Topics Concern   Not on file  Social History Narrative   Not on file   Social Determinants of Health   Financial Resource Strain: Not on file  Food Insecurity: No Food Insecurity (  05/31/2023)   Hunger Vital Sign    Worried About Running Out of Food in the Last Year: Never true    Ran Out of Food in the Last Year: Never true  Transportation Needs: No Transportation Needs (05/31/2023)   PRAPARE - Administrator, Civil Service (Medical): No    Lack of Transportation (Non-Medical): No  Physical Activity: Not on file  Stress: Not on file  Social Connections: Not on file  Intimate Partner Violence: Not At Risk (05/31/2023)   Humiliation, Afraid, Rape, and Kick questionnaire    Fear of Current or Ex-Partner: No    Emotionally Abused: No    Physically Abused: No    Sexually Abused: No    Review of Systems: See HPI, otherwise negative ROS  Physical Exam: BP (!) 155/104   Pulse 74   Temp (!) 96.2 F (35.7 C) (Temporal)   Resp 16   Wt (!) 148.3 kg   SpO2 97%    BMI 54.42 kg/m  General:   Alert,  pleasant and cooperative in NAD Head:  Normocephalic and atraumatic. Neck:  Supple; no masses or thyromegaly. Lungs:  Clear throughout to auscultation.    Heart:  Regular rate and rhythm. Abdomen:  Soft, nontender and nondistended. Normal bowel sounds, without guarding, and without rebound.   Neurologic:  Alert and  oriented x4;  grossly normal neurologically.  Impression/Plan: Linda Bell is here for an endoscopy and colonoscopy to be performed for IDA, chronic diarrhea  Risks, benefits, limitations, and alternatives regarding  endoscopy and colonoscopy have been reviewed with the patient.  Questions have been answered.  All parties agreeable.   Lannette Donath, MD  06/13/2023, 7:54 AM

## 2023-06-13 NOTE — Anesthesia Procedure Notes (Signed)
Date/Time: 06/13/2023 8:00 AM  Performed by: Ginger Carne, CRNAPre-anesthesia Checklist: Patient identified, Emergency Drugs available, Suction available, Patient being monitored and Timeout performed Patient Re-evaluated:Patient Re-evaluated prior to induction Oxygen Delivery Method: Supernova nasal CPAP Preoxygenation: Pre-oxygenation with 100% oxygen Induction Type: IV induction

## 2023-06-13 NOTE — Op Note (Signed)
[facility] Gastroenterology Patient Name: Linda Bell Procedure Date: 06/13/2023 7:24 AM MRN: [id number] Account #: [id number] Date of Birth: [birth year] Admit Type: Outpatient Age: 47 Room: [facility] ENDO ROOM 3 Gender: Female Note Status: Finalized Instrument Name: Upper Endoscope [id number] Procedure:             Upper GI endoscopy Indications:           Unexplained iron deficiency anemia, Diarrhea Providers:             Toney Reil MD, MD Referring MD:          No Local Md, MD (Referring MD) Medicines:             General Anesthesia Complications:         No immediate complications. Estimated blood loss: None. Procedure:             Pre-Anesthesia Assessment:                        - Prior to the procedure, a History and Physical was                         performed, and patient medications and allergies were                         reviewed. The patient is competent. The risks and                         benefits of the procedure and the sedation options and                         risks were discussed with the patient. All questions                         were answered and informed consent was obtained.                         Patient identification and proposed procedure were                         verified by the physician, the nurse, the                         anesthesiologist, the anesthetist and the technician                         in the pre-procedure area in the procedure room in the                         endoscopy suite. Mental Status Examination: alert and                         oriented. Airway Examination: normal oropharyngeal                         airway and neck mobility. Respiratory Examination:                         clear to auscultation. CV Examination: normal.  Prophylactic Antibiotics: The patient does not require                         prophylactic antibiotics. Prior Anticoagulants: The                          patient has taken no anticoagulant or antiplatelet                         agents. ASA Grade Assessment: III - A patient with                         severe systemic disease. After reviewing the risks and                         benefits, the patient was deemed in satisfactory                         condition to undergo the procedure. The anesthesia                         plan was to use general anesthesia. Immediately prior                         to administration of medications, the patient was                         re-assessed for adequacy to receive sedatives. The                         heart rate, respiratory rate, oxygen saturations,                         blood pressure, adequacy of pulmonary ventilation, and                         response to care were monitored throughout the                         procedure. The physical status of the patient was                         re-assessed after the procedure.                        After obtaining informed consent, the endoscope was                         passed under direct vision. Throughout the procedure,                         the patient's blood pressure, pulse, and oxygen                         saturations were monitored continuously. The Endoscope                         was introduced through the mouth, and advanced to the  second part of duodenum. The upper GI endoscopy was                         accomplished without difficulty. The patient tolerated                         the procedure fairly well. Findings:      The duodenal bulb and second portion of the duodenum were normal.       Biopsies were taken with a cold forceps for histology.      A few small sessile polyps with no bleeding and no stigmata of recent       bleeding were found in the gastric antrum.      The gastric body, incisura and gastric antrum were normal. Biopsies were       taken with a cold forceps for  histology.      The cardia and gastric fundus were normal on retroflexion.      Esophagogastric landmarks were identified: the gastroesophageal junction       was found at 37 cm from the incisors.      The gastroesophageal junction and examined esophagus were normal. Impression:            - Normal duodenal bulb and second portion of the                         duodenum. Biopsied.                        - A few gastric polyps.                        - Normal gastric body, incisura and antrum. Biopsied.                        - Esophagogastric landmarks identified.                        - Normal gastroesophageal junction and esophagus. Recommendation:        - Await pathology results.                        - Proceed with colonoscopy as scheduled                        See colonoscopy report Procedure Code(s):     --- Professional ---                        [phone], Esophagogastroduodenoscopy, flexible,                         transoral; with biopsy, single or multiple Diagnosis Code(s):     --- Professional ---                        K31.7, Polyp of stomach and duodenum                        D50.9, Iron deficiency anemia, unspecified                        R19.7, Diarrhea, unspecified CPT  copyright 2022 American Medical Association. All rights reserved. The codes documented in this report are preliminary and upon coder review may  be revised to meet current compliance requirements. Dr. Libby Maw Toney Reil MD, MD 06/13/2023 8:12:11 AM This report has been signed electronically. Number of Addenda: 0 Note Initiated On: 06/13/2023 7:24 AM Estimated Blood Loss:  Estimated blood loss: none.      [facility]

## 2023-06-13 NOTE — Op Note (Signed)
[facility] Gastroenterology Patient Name: Linda Bell Procedure Date: 06/13/2023 7:23 AM MRN: [id number] Account #: [id number] Date of Birth: 1975-10-01 Admit Type: Outpatient Age: 47 Room: [facility] ENDO ROOM 3 Gender: Female Note Status: Finalized Instrument Name: Colonscope [id number] Procedure:             Colonoscopy Indications:           This is the patient's first colonoscopy, Clinically                         significant diarrhea of unexplained origin Providers:             Toney Reil MD, MD Referring MD:          No Local Md, MD (Referring MD) Medicines:             General Anesthesia Complications:         No immediate complications. Estimated blood loss: None. Procedure:             Pre-Anesthesia Assessment:                        - Prior to the procedure, a History and Physical was                         performed, and patient medications and allergies were                         reviewed. The patient is competent. The risks and                         benefits of the procedure and the sedation options and                         risks were discussed with the patient. All questions                         were answered and informed consent was obtained.                         Patient identification and proposed procedure were                         verified by the physician, the nurse, the                         anesthesiologist, the anesthetist and the technician                         in the pre-procedure area in the procedure room in the                         endoscopy suite. Mental Status Examination: alert and                         oriented. Airway Examination: normal oropharyngeal                         airway and neck mobility. Respiratory Examination:  clear to auscultation. CV Examination: normal.                         Prophylactic Antibiotics: The patient does not require                          prophylactic antibiotics. Prior Anticoagulants: The                         patient has taken no anticoagulant or antiplatelet                         agents. ASA Grade Assessment: III - A patient with                         severe systemic disease. After reviewing the risks and                         benefits, the patient was deemed in satisfactory                         condition to undergo the procedure. The anesthesia                         plan was to use general anesthesia. Immediately prior                         to administration of medications, the patient was                         re-assessed for adequacy to receive sedatives. The                         heart rate, respiratory rate, oxygen saturations,                         blood pressure, adequacy of pulmonary ventilation, and                         response to care were monitored throughout the                         procedure. The physical status of the patient was                         re-assessed after the procedure.                        After obtaining informed consent, the colonoscope was                         passed under direct vision. Throughout the procedure,                         the patient's blood pressure, pulse, and oxygen                         saturations were monitored continuously. The  Colonoscope was introduced through the anus and                         advanced to the 10 cm into the ileum. The colonoscopy                         was performed without difficulty. The patient                         tolerated the procedure well. The quality of the bowel                         preparation was evaluated using the BBPS [facility] Bowel                         Preparation Scale) with scores of: Right Colon = 3,                         Transverse Colon = 3 and Left Colon = 3 (entire mucosa                         seen well with no residual staining, small fragments                          of stool or opaque liquid). The total BBPS score                         equals 9. The terminal ileum, ileocecal valve,                         appendiceal orifice, and rectum were photographed. Findings:      The perianal and digital rectal examinations were normal. Pertinent       negatives include normal sphincter tone and no palpable rectal lesions.      The terminal ileum appeared normal.      Two sessile polyps were found in the sigmoid colon and descending colon.       The polyps were 3 to 8 mm in size. These polyps were removed with a cold       snare. Resection and retrieval were complete. Estimated blood loss was       minimal. To prevent bleeding after the polypectomy, one hemostatic clip       was successfully placed (MR safe). Clip manufacturer: [employer].       There was no bleeding at the end of the procedure.      Normal mucosa was found in the left colon and in the right colon.       Biopsies for histology were taken with a cold forceps from the entire       colon for evaluation of microscopic colitis.      The retroflexed view of the distal rectum and anal verge was normal and       showed no anal or rectal abnormalities. Impression:            - The examined portion of the ileum was normal.                        - Two 3 to  8 mm polyps in the sigmoid colon and in the                         descending colon, removed with a cold snare. Resected                         and retrieved. Clip manufacturer: [employer].                         Clip (MR safe) was placed.                        - Normal mucosa in the left colon and in the right                         colon. Biopsied.                        - The distal rectum and anal verge are normal on                         retroflexion view. Recommendation:        - Discharge patient to home (with escort).                        - Resume previous diet today.                        - Continue  present medications.                        - Await pathology results.                        - Repeat colonoscopy in 3 - 5 years for surveillance                         based on pathology results. Procedure Code(s):     --- Professional ---                        [phone], Colonoscopy, flexible; with removal of                         tumor(s), polyp(s), or other lesion(s) by snare                         technique                        [id number], 59, Colonoscopy, flexible; with biopsy, single                         or multiple Diagnosis Code(s):     --- Professional ---                        D12.5, Benign neoplasm of sigmoid colon                        D12.4, Benign neoplasm of descending colon  R19.7, Diarrhea, unspecified CPT copyright 2022 American Medical Association. All rights reserved. The codes documented in this report are preliminary and upon coder review may  be revised to meet current compliance requirements. Dr. Libby Maw Toney Reil MD, MD 06/13/2023 8:38:36 AM This report has been signed electronically. Number of Addenda: 0 Note Initiated On: 06/13/2023 7:23 AM Scope Withdrawal Time: 0 hours 18 minutes 0 seconds  Total Procedure Duration: 0 hours 20 minutes 28 seconds  Estimated Blood Loss:  Estimated blood loss: none.      [facility]

## 2023-06-14 ENCOUNTER — Encounter: Payer: Self-pay | Admitting: Gastroenterology

## 2023-06-15 LAB — SURGICAL PATHOLOGY

## 2023-06-19 ENCOUNTER — Encounter: Payer: Self-pay | Admitting: Gastroenterology

## 2023-06-19 ENCOUNTER — Ambulatory Visit: Payer: Medicaid Other

## 2023-06-19 MED FILL — Ferumoxytol Inj 510 MG/17ML (30 MG/ML) (Elemental Fe): INTRAVENOUS | Qty: 17 | Status: AC

## 2023-06-20 ENCOUNTER — Inpatient Hospital Stay: Payer: Medicaid Other

## 2023-06-20 VITALS — BP 126/55 | HR 65 | Temp 98.4°F | Resp 20

## 2023-06-20 DIAGNOSIS — D500 Iron deficiency anemia secondary to blood loss (chronic): Secondary | ICD-10-CM

## 2023-06-20 DIAGNOSIS — D509 Iron deficiency anemia, unspecified: Secondary | ICD-10-CM | POA: Diagnosis not present

## 2023-06-20 MED ORDER — SODIUM CHLORIDE 0.9 % IV SOLN: Freq: Once | INTRAVENOUS | Status: AC

## 2023-06-20 MED ORDER — FERUMOXYTOL INJECTION 510 MG/17 ML
510.0000 mg | Freq: Once | INTRAVENOUS | Status: AC
  Administered 2023-06-20: 510 mg via INTRAVENOUS
  Filled 2023-06-20: qty 510

## 2023-06-20 NOTE — Patient Instructions (Signed)
 Ferumoxytol Injection What is this medication? FERUMOXYTOL (FER ue MOX i tol) treats low levels of iron in your body (iron deficiency anemia). Iron is a mineral that plays an important role in making red blood cells, which carry oxygen from your lungs to the rest of your body. This medicine may be used for other purposes; ask your health care provider or pharmacist if you have questions. COMMON BRAND NAME(S): Feraheme What should I tell my care team before I take this medication? They need to know if you have any of these conditions: Anemia not caused by low iron levels High levels of iron in the blood Magnetic resonance imaging (MRI) test scheduled An unusual or allergic reaction to iron, other medications, foods, dyes, or preservatives Pregnant or trying to get pregnant Breastfeeding How should I use this medication? This medication is injected into a vein. It is given by your care team in a hospital or clinic setting. Talk to your care team the use of this medication in children. Special care may be needed. Overdosage: If you think you have taken too much of this medicine contact a poison control center or emergency room at once. NOTE: This medicine is only for you. Do not share this medicine with others. What if I miss a dose? It is important not to miss your dose. Call your care team if you are unable to keep an appointment. What may interact with this medication? Other iron products This list may not describe all possible interactions. Give your health care provider a list of all the medicines, herbs, non-prescription drugs, or dietary supplements you use. Also tell them if you smoke, drink alcohol, or use illegal drugs. Some items may interact with your medicine. What should I watch for while using this medication? Visit your care team regularly. Tell your care team if your symptoms do not start to get better or if they get worse. You may need blood work done while you are taking this  medication. You may need to follow a special diet. Talk to your care team. Foods that contain iron include: whole grains/cereals, dried fruits, beans, or peas, leafy green vegetables, and organ meats (liver, kidney). What side effects may I notice from receiving this medication? Side effects that you should report to your care team as soon as possible: Allergic reactions--skin rash, itching, hives, swelling of the face, lips, tongue, or throat Low blood pressure--dizziness, feeling faint or lightheaded, blurry vision Shortness of breath Side effects that usually do not require medical attention (report to your care team if they continue or are bothersome): Flushing Headache Joint pain Muscle pain Nausea Pain, redness, or irritation at injection site This list may not describe all possible side effects. Call your doctor for medical advice about side effects. You may report side effects to FDA at 1-800-FDA-1088. Where should I keep my medication? This medication is given in a hospital or clinic. It will not be stored at home. NOTE: This sheet is a summary. It may not cover all possible information. If you have questions about this medicine, talk to your doctor, pharmacist, or health care provider.  2024 Elsevier/Gold Standard (2023-01-20 00:00:00)

## 2023-06-26 ENCOUNTER — Ambulatory Visit: Payer: Medicaid Other

## 2023-06-27 ENCOUNTER — Inpatient Hospital Stay: Payer: Medicaid Other

## 2023-06-27 VITALS — BP 125/72 | HR 58 | Temp 98.0°F | Resp 18

## 2023-06-27 DIAGNOSIS — D509 Iron deficiency anemia, unspecified: Secondary | ICD-10-CM | POA: Diagnosis not present

## 2023-06-27 DIAGNOSIS — D500 Iron deficiency anemia secondary to blood loss (chronic): Secondary | ICD-10-CM

## 2023-06-27 MED ORDER — SODIUM CHLORIDE 0.9 % IV SOLN: Freq: Once | INTRAVENOUS | Status: AC

## 2023-06-27 MED ORDER — SODIUM CHLORIDE 0.9 % IV SOLN
510.0000 mg | Freq: Once | INTRAVENOUS | Status: AC
  Administered 2023-06-27: 510 mg via INTRAVENOUS
  Filled 2023-06-27: qty 510

## 2023-06-27 NOTE — Patient Instructions (Signed)
 Ferumoxytol Injection What is this medication? FERUMOXYTOL (FER ue MOX i tol) treats low levels of iron in your body (iron deficiency anemia). Iron is a mineral that plays an important role in making red blood cells, which carry oxygen from your lungs to the rest of your body. This medicine may be used for other purposes; ask your health care provider or pharmacist if you have questions. COMMON BRAND NAME(S): Feraheme What should I tell my care team before I take this medication? They need to know if you have any of these conditions: Anemia not caused by low iron levels High levels of iron in the blood Magnetic resonance imaging (MRI) test scheduled An unusual or allergic reaction to iron, other medications, foods, dyes, or preservatives Pregnant or trying to get pregnant Breastfeeding How should I use this medication? This medication is injected into a vein. It is given by your care team in a hospital or clinic setting. Talk to your care team the use of this medication in children. Special care may be needed. Overdosage: If you think you have taken too much of this medicine contact a poison control center or emergency room at once. NOTE: This medicine is only for you. Do not share this medicine with others. What if I miss a dose? It is important not to miss your dose. Call your care team if you are unable to keep an appointment. What may interact with this medication? Other iron products This list may not describe all possible interactions. Give your health care provider a list of all the medicines, herbs, non-prescription drugs, or dietary supplements you use. Also tell them if you smoke, drink alcohol, or use illegal drugs. Some items may interact with your medicine. What should I watch for while using this medication? Visit your care team regularly. Tell your care team if your symptoms do not start to get better or if they get worse. You may need blood work done while you are taking this  medication. You may need to follow a special diet. Talk to your care team. Foods that contain iron include: whole grains/cereals, dried fruits, beans, or peas, leafy green vegetables, and organ meats (liver, kidney). What side effects may I notice from receiving this medication? Side effects that you should report to your care team as soon as possible: Allergic reactions--skin rash, itching, hives, swelling of the face, lips, tongue, or throat Low blood pressure--dizziness, feeling faint or lightheaded, blurry vision Shortness of breath Side effects that usually do not require medical attention (report to your care team if they continue or are bothersome): Flushing Headache Joint pain Muscle pain Nausea Pain, redness, or irritation at injection site This list may not describe all possible side effects. Call your doctor for medical advice about side effects. You may report side effects to FDA at 1-800-FDA-1088. Where should I keep my medication? This medication is given in a hospital or clinic. It will not be stored at home. NOTE: This sheet is a summary. It may not cover all possible information. If you have questions about this medicine, talk to your doctor, pharmacist, or health care provider.  2024 Elsevier/Gold Standard (2023-01-20 00:00:00)

## 2023-06-28 ENCOUNTER — Encounter: Payer: Self-pay | Admitting: Psychiatry

## 2023-06-28 ENCOUNTER — Ambulatory Visit (INDEPENDENT_AMBULATORY_CARE_PROVIDER_SITE_OTHER): Payer: Medicaid Other | Admitting: Psychiatry

## 2023-06-28 VITALS — BP 135/81 | HR 67 | Temp 96.8°F | Ht 65.0 in | Wt 333.0 lb

## 2023-06-28 DIAGNOSIS — F4381 Prolonged grief disorder: Secondary | ICD-10-CM | POA: Diagnosis not present

## 2023-06-28 DIAGNOSIS — R0683 Snoring: Secondary | ICD-10-CM | POA: Insufficient documentation

## 2023-06-28 DIAGNOSIS — Z79899 Other long term (current) drug therapy: Secondary | ICD-10-CM

## 2023-06-28 DIAGNOSIS — G4700 Insomnia, unspecified: Secondary | ICD-10-CM | POA: Insufficient documentation

## 2023-06-28 DIAGNOSIS — F331 Major depressive disorder, recurrent, moderate: Secondary | ICD-10-CM | POA: Diagnosis not present

## 2023-06-28 DIAGNOSIS — F411 Generalized anxiety disorder: Secondary | ICD-10-CM | POA: Diagnosis not present

## 2023-06-28 MED ORDER — MIRTAZAPINE 7.5 MG PO TABS: 7.5000 mg | ORAL_TABLET | Freq: Every day | ORAL | 1 refills | Status: DC

## 2023-06-28 NOTE — Progress Notes (Unsigned)
Psychiatric Initial Adult Assessment   Patient Identification: Linda Bell MRN:  [id number] Date of Evaluation:  06/28/2023 Referral Source: Archie Endo FNP Chief Complaint:   Chief Complaint  Patient presents with   Establish Care   Depression   Anxiety   Medication Refill   Insomnia   Visit Diagnosis:    ICD-10-CM   1. Moderate episode of recurrent major depressive disorder (HCC)  F33.1 mirtazapine (REMERON) 7.5 MG tablet    2. GAD (generalized anxiety disorder)  F41.1 mirtazapine (REMERON) 7.5 MG tablet    3. 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 Insomnia, unspecified type  G47.00     5. Snoring  R06.83 Ambulatory referral to Pulmonology    6. High risk medication use  Z79.899 Urine drugs of abuse scrn w alc, routine (Ref Lab)      History of Present Illness:  Linda Bell is a 47 year old Caucasian female, widowed, currently lives with her boyfriend, unemployed, lives in [place], who has a history of depression, anxiety, IBS, hypothyroidism, morbidly obese, was evaluated in office today, presented to establish care.  Patient today reports she has been struggling with significant mood symptoms, depression as well as anxiety since the past several years.  She reports she had back-to-back deaths in her family since July 29, 2017.  Her husband passed away of medical reasons in 2017/07/29.  She reports she continues to have intrusive memories about how he died often.  Patient reports her stepdaughter passed away in 07/30/2019 of a drug overdose.  Her mother passed away due to medical reasons 2 months later in 2019-07-30.  She reports she also lost her brother 20 years ago due to a drug overdose.  She reports she started dating after the death of her husband and her boyfriend passed away after a couple of months of them dating of medical reasons.  Patient reports she struggles with a lot of self blame, guilt, intrusive memories, racing thoughts, flashbacks about their deaths.   Patient reports she often wonders if she could have stopped the death of her mother if she had checked in on her a few minutes earlier.  That does consume her at times.  Patient reports a lot of sadness, crying spells, low motivation, low energy.  She also reports sleep problems, some nights she cannot sleep at all and other times she sleeps a lot during the day and just stays in bed.  She does have snoring as well as apneic episodes however has never had a sleep apnea testing completed.  She is interested in the same.  Patient reports she struggles with a lot of anxiety symptoms, worrying as noted above as well as anxiety attacks.  She reports she has constant feeling of her chest hurting, and has these episodes of heart palpitation, shortness of breath, feeling cold and clammy which can last for a few minutes.  She reports she avoids situations that can trigger her anxiety attacks like being in public places, being in a crowd.  She reports she has tried medications like hydroxyzine in the past which has not helped with her anxiety attacks.  Patient denies any history of trauma other than the trauma of deaths as noted above.  Patient denies any eating disorder problems.  She however reports she snacks a lot and does not have a good schedule with her eating habits.  Patient does report obsessions, reports she gets obsessive about certain TV shows that she watches and replay it in her mind a  lot and does a lot of research on that.  Other than that she denies any other obsessive or compulsive behaviors.  Patient denies any suicidality, homicidality or perceptual disturbances.  Patient reports she is currently taking fluoxetine, has been taking it since the past several years.  Tried an 80 mg previously however later on since she did not have health insurance plan she was being seen by an online telemedicine provider who changed her prescription to a 60 mg daily.  She believes the fluoxetine helps to some  extent with her depression symptoms but not so much with anxiety.  Denies any side effects at this time.  Patient denies any other concerns today. Associated Signs/Symptoms: Depression Symptoms:  depressed mood, anhedonia, insomnia, hypersomnia, psychomotor agitation, psychomotor retardation, fatigue, feelings of worthlessness/guilt, difficulty concentrating, hopelessness, anxiety, panic attacks, (Hypo) Manic Symptoms:   Denies Anxiety Symptoms:  Excessive Worry, Panic Symptoms, Psychotic Symptoms:   Denies PTSD Symptoms: Had a traumatic exposure:  as noted above  Past Psychiatric History: Patient denies inpatient behavioral health admissions.  Patient reports she used to be under the care of a psychiatrist several years ago, 20 years ago while in [place].  Most recently medications were being managed by primary care provider.  Patient denies suicide attempts or self-injurious behaviors.  Previous Psychotropic Medications: Yes past trials of medications like Zoloft, Lexapro, Wellbutrin, BuSpar, hydroxyzine  Substance Abuse History in the last 12 months:  No.  Consequences of Substance Abuse: Negative  Past Medical History:  Past Medical History:  Diagnosis Date   Allergy    Anxiety    Arrhythmia    Asthma    Depression    GERD (gastroesophageal reflux disease)    Thyroid disease    hypo    Past Surgical History:  Procedure Laterality Date   BIOPSY  06/13/2023   Procedure: BIOPSY;  Surgeon: Toney Reil, MD;  Location: [facility] ENDOSCOPY;  Service: Gastroenterology;;   CHOLECYSTECTOMY  2009   COLONOSCOPY WITH PROPOFOL N/A 06/13/2023   Procedure: COLONOSCOPY WITH PROPOFOL;  Surgeon: Toney Reil, MD;  Location: [facility] ENDOSCOPY;  Service: Gastroenterology;  Laterality: N/A;   ESOPHAGOGASTRODUODENOSCOPY (EGD) WITH PROPOFOL N/A 06/13/2023   Procedure: ESOPHAGOGASTRODUODENOSCOPY (EGD) WITH PROPOFOL;  Surgeon: Toney Reil, MD;  Location: [facility]  ENDOSCOPY;  Service: Gastroenterology;  Laterality: N/A;   HEMOSTASIS CLIP PLACEMENT  06/13/2023   Procedure: HEMOSTASIS CLIP PLACEMENT;  Surgeon: Toney Reil, MD;  Location: [facility] ENDOSCOPY;  Service: Gastroenterology;;   POLYPECTOMY  06/13/2023   Procedure: POLYPECTOMY;  Surgeon: Toney Reil, MD;  Location: [facility] ENDOSCOPY;  Service: Gastroenterology;;    Family Psychiatric History: As noted below.  Family History:  Family History  Problem Relation Age of Onset   Cancer Mother        basal cell (> 1 occurence)   Depression Mother    Cancer Father        renal   Drug abuse Father        PASSED AWAY FROM DRUG OVERDOSE   Mental illness Father    Depression Father    Mental illness Brother        PASSED AWAY FROM DRUG OVERDOSE   Breast cancer Maternal Aunt    Schizophrenia Maternal Grandfather    Prostate cancer Maternal Grandfather    Depression Maternal Grandfather    Mental illness Maternal Grandfather    Heart attack Maternal Grandmother    Heart attack Paternal Grandfather    Depression Paternal Grandmother    Mental illness  Cousin     Social History:   Social History   Socioeconomic History   Marital status: Widowed    Spouse name: Not on file   Number of children: 1   Years of education: 12 + 2   Highest education level: Not on file  Occupational History   Occupation: STAY AT HOME MOM  Tobacco Use   Smoking status: Never   Smokeless tobacco: Never   Tobacco comments:    second hand smoke exposure in home as child,  and adult none last 15 years  Vaping Use   Vaping status: Former  Substance and Sexual Activity   Alcohol use: No   Drug use: No   Sexual activity: Yes    Birth control/protection: None  Other Topics Concern   Not on file  Social History Narrative   Not on file   Social Determinants of Health   Financial Resource Strain: Not on file  Food Insecurity: No Food Insecurity (05/31/2023)   Hunger Vital Sign    Worried About  Running Out of Food in the Last Year: Never true    Ran Out of Food in the Last Year: Never true  Transportation Needs: No Transportation Needs (05/31/2023)   PRAPARE - Administrator, Civil Service (Medical): No    Lack of Transportation (Non-Medical): No  Physical Activity: Not on file  Stress: Not on file  Social Connections: Not on file    Additional Social History: Patient was born and raised in [place].  She was raised by both parents.  Patient reports she has a GED and associate degree.  She used to work in the past however her ex-husband did not believe in her working so she stayed home.  She continues to be unemployed.  She reports she had one brother however he is deceased more than 20 years ago.  Patient was married x 1.  She is widowed.  Her husband passed away in 07/15/2017.  Patient has 1 biological son who currently lives in [place], [place].  He is married.  She reports her stepdaughter passed away in 2019-07-16, she raised her all her life.  Patient currently lives with her boyfriend of 4 years.  They live in [facility].  Patient stays home and takes care of his mother who has dementia.  He also has an 19 year old son that she raises.  Patient reports boyfriend is supportive.  She is religious.  She denies having access to guns.  Patient denies any legal issues.  Allergies:  No Known Allergies  Metabolic Disorder Labs: No results found for: "HGBA1C", "MPG" No results found for: "PROLACTIN" Lab Results  Component Value Date   CHOL 169 04/18/2017   TRIG 76 04/18/2017   HDL 45 (L) 04/18/2017   CHOLHDL 3.8 04/18/2017   VLDL 15 04/18/2017   LDLCALC 109 (H) 04/18/2017   Lab Results  Component Value Date   TSH 5.88 (H) 04/18/2017    Therapeutic Level Labs: No results found for: "LITHIUM" No results found for: "CBMZ" No results found for: "VALPROATE"  Current Medications: Current Outpatient Medications  Medication Sig Dispense Refill   albuterol (PROVENTIL  HFA;VENTOLIN HFA) 108 (90 Base) MCG/ACT inhaler Inhale 1-2 puffs into the lungs every 6 (six) hours as needed for wheezing or shortness of breath. 1 Inhaler 5   dicyclomine (BENTYL) 10 MG capsule Take 1 capsule (10 mg total) by mouth 3 (three) times daily before meals. 90 capsule 2   FLUoxetine (PROZAC) 20 MG  capsule Take 60 mg by mouth daily.     ibuprofen (ADVIL) 100 MG tablet Take by mouth.     levothyroxine (SYNTHROID, LEVOTHROID) 50 MCG tablet TAKE 1 TABLET (50 MCG TOTAL) BY MOUTH DAILY. 30 tablet 0   mirtazapine (REMERON) 7.5 MG tablet Take 1 tablet (7.5 mg total) by mouth at bedtime. 30 tablet 1   hydrOXYzine (ATARAX/VISTARIL) 25 MG tablet Take 0.5-1 tablets (12.5-25 mg total) by mouth every 8 (eight) hours as needed for anxiety. NEED APPOINTMENT (Patient not taking: Reported on 06/28/2023) 30 tablet 0   omeprazole (PRILOSEC) 40 MG capsule Take 20 mg by mouth once.     No current facility-administered medications for this visit.    Musculoskeletal: Strength & Muscle Tone: within normal limits Gait & Station: normal Patient leans: N/A  Psychiatric Specialty Exam: Review of Systems  Psychiatric/Behavioral:  Positive for dysphoric mood and sleep disturbance. The patient is nervous/anxious.     Blood pressure 135/81, pulse 67, temperature (!) 96.8 F (36 C), temperature source Skin, height 5\' 5"  (1.651 m), weight (!) 333 lb (151 kg), last menstrual period 05/23/2023.Body mass index is 55.41 kg/m.  General Appearance: Casual  Eye Contact:  Fair  Speech:  Clear and Coherent  Volume:  Normal  Mood:  Anxious and Depressed  Affect:  Tearful  Thought Process:  Goal Directed and Descriptions of Associations: Intact  Orientation:  Full (Time, Place, and Person)  Thought Content:  Logical  Suicidal Thoughts:  No  Homicidal Thoughts:  No  Memory:  Immediate;   Fair Recent;   Fair Remote;   Fair  Judgement:  Fair  Insight:  Fair  Psychomotor Activity:  Normal  Concentration:   Concentration: Fair and Attention Span: Fair  Recall:  [employer] of Knowledge:Fair  Language: Fair  Akathisia:  No  Handed:  Right  AIMS (if indicated):  not done  Assets:  Communication Skills Desire for Improvement Housing Intimacy Social Support Transportation  ADL's:  Intact  Cognition: WNL  Sleep:  Poor   Screenings: GAD-7    Loss adjuster, chartered Office Visit from 06/28/2023 in [facility] Office Visit from 11/03/2017 in [facility]  Total GAD-7 Score 15 13      PHQ2-9    Flowsheet Row Office Visit from 06/28/2023 in [facility] Office Visit from 05/31/2023 in [facility] Visit from 11/03/2017 in [facility] Office Visit from 04/18/2017 in [facility] Office Visit from 03/07/2017 in [facility]  PHQ-2 Total Score 3 4 3 4 4   PHQ-9 Total Score 12 -- 9 14 15       Flowsheet Row Office Visit from 06/28/2023 in [facility] Admission (Discharged) from 06/13/2023 in [facility]  C-SSRS RISK CATEGORY No Risk No Risk       Assessment and Plan: Linda Bell is a 47 year old Caucasian female, widowed, currently lives with her boyfriend, unemployed, lives in [place], who has a history of depression, anxiety, IBS, hypothyroidism, morbidly obese, was evaluated in office today.  Patient with continued depression, anxiety symptoms trauma related symptoms, prolonged complex bereavement from back-to-back deaths in the family, will benefit from medication readjustment, psychotherapy sessions, plan as noted below.  Plan  MDD-unstable Continue fluoxetine 60 mg p.o. daily Start mirtazapine 7.5 mg at bedtime Patient advised to establish care with a  therapist-provided resources.  GAD-unstable Start  mirtazapine 7.5 mg p.o. nightly Patient to establish care with therapist. We will consider adding low dosage clonazepam as needed for significant anxiety symptoms.  However will need a urine drug screen prior to initiation.  Persistent complex bereavement disorder-unstable Patient will benefit from intensive psychotherapy sessions on an individual basis.  Provided resources for the same.  Insomnia/snoring-unstable Ambulatory referral to pulmonology for sleep study. Mirtazapine 7.5 mg p.o. nightly started as noted above.  Will help with sleep also.  High risk medication use-will order urine drug screen.  Patient to go to [facility] lab.  Collaboration of Care: Referral or follow-up with counselor/therapist AEB patient encouraged to establish care with therapist.  Patient/Guardian was advised Release of Information must be obtained prior to any record release in order to collaborate their care with an outside provider. Patient/Guardian was advised if they have not already done so to contact the registration department to sign all necessary forms in order for [place] to release information regarding their care.   Consent: Patient/Guardian gives verbal consent for treatment and assignment of benefits for services provided during this visit. Patient/Guardian expressed understanding and agreed to proceed.   Follow-up in clinic in 4 weeks or sooner if needed.  This note was generated in part or whole with voice recognition software. Voice recognition is usually quite accurate but there are transcription errors that can and very often do occur. I apologize for any typographical errors that were not detected and corrected.     Jomarie Longs, MD 10/31/20241:59 PM

## 2023-06-28 NOTE — Patient Instructions (Addendum)
Mirtazapine Tablets What is this medication? MIRTAZAPINE (mir TAZ a peen) treats depression. It increases the amount of serotonin and norepinephrine in the brain, hormones that help regulate mood. This medicine may be used for other purposes; ask your health care provider or pharmacist if you have questions. COMMON BRAND NAME(S): Remeron What should I tell my care team before I take this medication? They need to know if you have any of these conditions: Bipolar disorder Glaucoma Kidney disease Liver disease Suicidal thoughts An unusual or allergic reaction to mirtazapine, other medications, foods, dyes, or preservatives Pregnant or trying to get pregnant Breast-feeding How should I use this medication? Take this medication by mouth with a glass of water. Follow the directions on the prescription label. Take your medication at regular intervals. Do not take your medication more often than directed. Do not stop taking this medication suddenly except upon the advice of your care team. Stopping this medication too quickly may cause serious side effects or your condition may worsen. A special MedGuide will be given to you by the pharmacist with each prescription and refill. Be sure to read this information carefully each time. Talk to your care team about the use of this medication in children. Special care may be needed. Overdosage: If you think you have taken too much of this medicine contact a poison control center or emergency room at once. NOTE: This medicine is only for you. Do not share this medicine with others. What if I miss a dose? If you miss a dose, take it as soon as you can. If it is almost time for your next dose, take only that dose. Do not take double or extra doses. What may interact with this medication? Do not take this medication with any of the following: Linezolid MAOIs, such as Carbex, Eldepryl, Marplan, Nardil, and Parnate Methylene blue (injected into a vein) This  medication may also interact with the following: Alcohol Antivirals for HIV or AIDS Certain medications that treat or prevent blood clots, such as warfarin Certain medications for fungal infections, such as ketoconazole and itraconazole Certain medications for mental health conditions Certain medications for migraine headache, such as almotriptan, eletriptan, frovatriptan, naratriptan, rizatriptan, sumatriptan, zolmitriptan Certain medications for seizures, such as carbamazepine or phenytoin Certain medications for sleep Cimetidine Erythromycin Fentanyl Lithium Medications for blood pressure Nefazodone Rasagiline Rifampin Supplements, such as St. John's wort, kava kava, valerian Tramadol Tryptophan This list may not describe all possible interactions. Give your health care provider a list of all the medicines, herbs, non-prescription drugs, or dietary supplements you use. Also tell them if you smoke, drink alcohol, or use illegal drugs. Some items may interact with your medicine. What should I watch for while using this medication? Visit your care team for regular checks on your progress. It may be some time before you see the benefit from this medication. This medication may cause thoughts of suicide or depression. This includes sudden changes in mood, behaviors, or thoughts. These changes can happen at any time but are more common in the beginning of treatment or after a change in dose. Call your care team right away if you experience these thoughts or worsening depression. This medication may affect your coordination, reaction time, or judgment. Do not drive or operate machinery until you know how this medication affects you. Sit up or stand slowly to reduce the risk of dizzy or fainting spells. Drinking alcohol with this medication can increase the risk of these side effects. This medication may cause dry  eyes and blurred vision. If you wear contact lenses, you may feel some discomfort.  Lubricating eye drops may help. See your care team if the problem does not go away or is severe. Your mouth may get dry. Chewing sugarless gum or sucking hard candy and drinking plenty of water may help. Contact your care team if the problem does not go away or is severe. What side effects may I notice from receiving this medication? Side effects that you should report to your care team as soon as possible: Allergic reactions--skin rash, itching, hives, swelling of the face, lips, tongue, or throat Heart rhythm changes--fast or irregular heartbeat, dizziness, feeling faint or lightheaded, chest pain, trouble breathing Infection--fever, chills, cough, or sore throat Irritability, confusion, fast or irregular heartbeat, muscle stiffness, twitching muscles, sweating, high fever, seizure, chills, vomiting, diarrhea, which may be signs of serotonin syndrome Low sodium level--muscle weakness, fatigue, dizziness, headache, confusion Rash, fever, and swollen lymph nodes Redness, blistering, peeling or loosening of the skin, including inside the mouth Seizures Sudden eye pain or change in vision such as blurry vision, seeing halos around lights, vision loss Thoughts of suicide or self-harm, worsening mood, feelings of depression Side effects that usually do not require medical attention (report to your care team if they continue or are bothersome): Constipation Dizziness Drowsiness Dry mouth Increase in appetite Weight gain This list may not describe all possible side effects. Call your doctor for medical advice about side effects. You may report side effects to FDA at 1-800-FDA-1088. Where should I keep my medication? Keep out of the reach of children. Store at room temperature between 15 and 30 degrees C (59 and 86 degrees F) Protect from light and moisture. Throw away any unused medication after the expiration date. NOTE: This sheet is a summary. It may not cover all possible information. If you  have questions about this medicine, talk to your doctor, pharmacist, or health care provider.  2024 Elsevier/Gold Standard (2022-04-13 00:00:00)  Calorie Counting for Weight Loss Calories are units of energy. Your body needs a certain number of calories from food to keep going throughout the day. When you eat or drink more calories than your body needs, your body stores the extra calories mostly as fat. When you eat or drink fewer calories than your body needs, your body burns fat to get the energy it needs. Calorie counting means keeping track of how many calories you eat and drink each day. Calorie counting can be helpful if you need to lose weight. If you eat fewer calories than your body needs, you should lose weight. Ask your health care provider what a healthy weight is for you. For calorie counting to work, you will need to eat the right number of calories each day to lose a healthy amount of weight per week. A dietitian can help you figure out how many calories you need in a day and will suggest ways to reach your calorie goal. A healthy amount of weight to lose each week is usually 1-2 lb (0.5-0.9 kg). This usually means that your daily calorie intake should be reduced by 500-750 calories. Eating 1,200-1,500 calories a day can help most women lose weight. Eating 1,500-1,800 calories a day can help most men lose weight. What do I need to know about calorie counting? Work with your health care provider or dietitian to determine how many calories you should get each day. To meet your daily calorie goal, you will need to: Find out how many calories  are in each food that you would like to eat. Try to do this before you eat. Decide how much of the food you plan to eat. Keep a food log. Do this by writing down what you ate and how many calories it had. To successfully lose weight, it is important to balance calorie counting with a healthy lifestyle that includes regular activity. Where do I find  calorie information?  The number of calories in a food can be found on a Nutrition Facts label. If a food does not have a Nutrition Facts label, try to look up the calories online or ask your dietitian for help. Remember that calories are listed per serving. If you choose to have more than one serving of a food, you will have to multiply the calories per serving by the number of servings you plan to eat. For example, the label on a package of bread might say that a serving size is 1 slice and that there are 90 calories in a serving. If you eat 1 slice, you will have eaten 90 calories. If you eat 2 slices, you will have eaten 180 calories. How do I keep a food log? After each time that you eat, record the following in your food log as soon as possible: What you ate. Be sure to include toppings, sauces, and other extras on the food. How much you ate. This can be measured in cups, ounces, or number of items. How many calories were in each food and drink. The total number of calories in the food you ate. Keep your food log near you, such as in a pocket-sized notebook or on an app or website on your mobile phone. Some programs will calculate calories for you and show you how many calories you have left to meet your daily goal. What are some portion-control tips? Know how many calories are in a serving. This will help you know how many servings you can have of a certain food. Use a measuring cup to measure serving sizes. You could also try weighing out portions on a kitchen scale. With time, you will be able to estimate serving sizes for some foods. Take time to put servings of different foods on your favorite plates or in your favorite bowls and cups so you know what a serving looks like. Try not to eat straight from a food's packaging, such as from a bag or box. Eating straight from the package makes it hard to see how much you are eating and can lead to overeating. Put the amount you would like to eat in  a cup or on a plate to make sure you are eating the right portion. Use smaller plates, glasses, and bowls for smaller portions and to prevent overeating. Try not to multitask. For example, avoid watching TV or using your computer while eating. If it is time to eat, sit down at a table and enjoy your food. This will help you recognize when you are full. It will also help you be more mindful of what and how much you are eating. What are tips for following this plan? Reading food labels Check the calorie count compared with the serving size. The serving size may be smaller than what you are used to eating. Check the source of the calories. Try to choose foods that are high in protein, fiber, and vitamins, and low in saturated fat, trans fat, and sodium. Shopping Read nutrition labels while you shop. This will help you make healthy  decisions about which foods to buy. Pay attention to nutrition labels for low-fat or fat-free foods. These foods sometimes have the same number of calories or more calories than the full-fat versions. They also often have added sugar, starch, or salt to make up for flavor that was removed with the fat. Make a grocery list of lower-calorie foods and stick to it. Cooking Try to cook your favorite foods in a healthier way. For example, try baking instead of frying. Use low-fat dairy products. Meal planning Use more fruits and vegetables. One-half of your plate should be fruits and vegetables. Include lean proteins, such as chicken, [place], and fish. Lifestyle Each week, aim to do one of the following: 150 minutes of moderate exercise, such as walking. 75 minutes of vigorous exercise, such as running. General information Know how many calories are in the foods you eat most often. This will help you calculate calorie counts faster. Find a way of tracking calories that works for you. Get creative. Try different apps or programs if writing down calories does not work for  you. What foods should I eat?  Eat nutritious foods. It is better to have a nutritious, high-calorie food, such as an avocado, than a food with few nutrients, such as a bag of potato chips. Use your calories on foods and drinks that will fill you up and will not leave you hungry soon after eating. Examples of foods that fill you up are nuts and nut butters, vegetables, lean proteins, and high-fiber foods such as whole grains. High-fiber foods are foods with more than 5 g of fiber per serving. Pay attention to calories in drinks. Low-calorie drinks include water and unsweetened drinks. The items listed above may not be a complete list of foods and beverages you can eat. Contact a dietitian for more information. What foods should I limit? Limit foods or drinks that are not good sources of vitamins, minerals, or protein or that are high in unhealthy fats. These include: Candy. Other sweets. Sodas, specialty coffee drinks, alcohol, and juice. The items listed above may not be a complete list of foods and beverages you should avoid. Contact a dietitian for more information. How do I count calories when eating out? Pay attention to portions. Often, portions are much larger when eating out. Try these tips to keep portions smaller: Consider sharing a meal instead of getting your own. If you get your own meal, eat only half of it. Before you start eating, ask for a container and put half of your meal into it. When available, consider ordering smaller portions from the menu instead of full portions. Pay attention to your food and drink choices. Knowing the way food is cooked and what is included with the meal can help you eat fewer calories. If calories are listed on the menu, choose the lower-calorie options. Choose dishes that include vegetables, fruits, whole grains, low-fat dairy products, and lean proteins. Choose items that are boiled, broiled, grilled, or steamed. Avoid items that are buttered,  battered, fried, or served with cream sauce. Items labeled as crispy are usually fried, unless stated otherwise. Choose water, low-fat milk, unsweetened iced tea, or other drinks without added sugar. If you want an alcoholic beverage, choose a lower-calorie option, such as a glass of wine or light beer. Ask for dressings, sauces, and syrups on the side. These are usually high in calories, so you should limit the amount you eat. If you want a salad, choose a garden salad and ask for  grilled meats. Avoid extra toppings such as bacon, cheese, or fried items. Ask for the dressing on the side, or ask for olive oil and vinegar or lemon to use as dressing. Estimate how many servings of a food you are given. Knowing serving sizes will help you be aware of how much food you are eating at restaurants. Where to find more information Centers for Disease Control and Prevention: FootballExhibition.com.br U.S. Department of Agriculture: WrestlingReporter.dk Summary Calorie counting means keeping track of how many calories you eat and drink each day. If you eat fewer calories than your body needs, you should lose weight. A healthy amount of weight to lose per week is usually 1-2 lb (0.5-0.9 kg). This usually means reducing your daily calorie intake by 500-750 calories. The number of calories in a food can be found on a Nutrition Facts label. If a food does not have a Nutrition Facts label, try to look up the calories online or ask your dietitian for help. Use smaller plates, glasses, and bowls for smaller portions and to prevent overeating. Use your calories on foods and drinks that will fill you up and not leave you hungry shortly after a meal. This information is not intended to replace advice given to you by your health care provider. Make sure you discuss any questions you have with your health care provider. Document Revised: 09/26/2019 Document Reviewed: 09/26/2019 Elsevier Patient Education  2023 Elsevier  Inc.    www.openpathcollective.org  www.psychologytoday  [email or web address].wixsite.com [facility] [address], [place], [place] [address]   [phone]  [facility] [email or web address] [address] [place], [place] [address]  [phone]  [facility] [email or web address] [address] [place], [place] [address]  [phone]   [facility] - [phone]  [facility] - [phone]  [facility] - [phone] counseling [phone]  [facility] [phone]   PodPark.tn this clinician can offer telehealth and has a sliding scale option  https://clark-gentry.info/ this group also offers sliding scale rates and is based out of [place]  Dr. Liborio Nixon with the [facility] specializes in divorce  [employer] [facility] has interns who offer sliding scale rates and some of the full time clinicians do, as well. You complete their contact form on their website and the referrals coordinator will help to get connected to someone   Medicaid below :  [facility] [address] [place], [place] [address]  [phone]    [employer] [address] [place], [place] [address]  [phone]    [facility] [address] [place], [place] [address]  [phone]

## 2023-07-07 ENCOUNTER — Encounter: Payer: Self-pay | Admitting: Internal Medicine

## 2023-07-13 NOTE — Progress Notes (Signed)
Celso Amy, PA-C [address]  [address]  [place], [place] [address]  Main: [phone]  Fax: [phone]   Primary Care Physician: Linda Parents, FNP  Primary Gastroenterologist:  Celso Amy, PA-C / Dr. Lannette Donath    CC: Follow-up IBS, diarrhea, anemia  HPI: Linda Bell is a 48 y.o. female returns for 63-month follow-up of microcytic anemia, and irritable bowel syndrome diarrhea predominant since 2004.  Labs 04/18/2023: Negative celiac lab.  Hemoglobin 11.2.  Vitamin B12 of 304.  Normal folate.  Total iron 43, iron saturation 12%, ferritin 29.  She has been evaluated by hematologist Dr. Melvyn Neth in [place].  EGD by Dr. Allegra Lai 06/13/2023 showed a few small benign gastric polyps, otherwise normal.  Biopsies negative for celiac and H. pylori.  Colonoscopy 06/13/2023 by Dr. Allegra Lai showed 2 (3 mm to 8 mm) tubular adenoma and hyperplastic polyps removed from sigmoid and descending colon. Good prep.  Biopsies negative for microscopic colitis.  5-year repeat.  She tried dicyclomine 10 Mg 3 times daily in the past 2 months.  Also takes omeprazole 40 Mg daily.  Patient states dicyclomine 10 mg did not help.  She takes it after eating.  She denies adverse side effects.  She is still having daily lower abdominal cramping and diarrhea after eating meals.  Has had these chronic symptoms for 30 years attributed to IBS.  Typically has 2-4 loose stools daily.  She tried probiotic in the past which made her symptoms worse.  Current Outpatient Medications  Medication Sig Dispense Refill   albuterol (PROVENTIL HFA;VENTOLIN HFA) 108 (90 Base) MCG/ACT inhaler Inhale 1-2 puffs into the lungs every 6 (six) hours as needed for wheezing or shortness of breath. 1 Inhaler 5   cholestyramine (QUESTRAN) 4 g packet Take 1 packet (4 g total) by mouth daily. 30 packet 11   dicyclomine (BENTYL) 20 MG tablet Take 1 tablet (20 mg total) by mouth 3 (three) times daily as needed for spasms (for Abdominal  Cramping). 90 tablet 11   FLUoxetine (PROZAC) 20 MG capsule Take 60 mg by mouth daily.     hydrOXYzine (ATARAX/VISTARIL) 25 MG tablet Take 0.5-1 tablets (12.5-25 mg total) by mouth every 8 (eight) hours as needed for anxiety. NEED APPOINTMENT 30 tablet 0   ibuprofen (ADVIL) 100 MG tablet Take by mouth.     levothyroxine (SYNTHROID, LEVOTHROID) 50 MCG tablet TAKE 1 TABLET (50 MCG TOTAL) BY MOUTH DAILY. 30 tablet 0   mirtazapine (REMERON) 7.5 MG tablet Take 1 tablet (7.5 mg total) by mouth at bedtime. 30 tablet 1   omeprazole (PRILOSEC) 40 MG capsule Take 20 mg by mouth once.     No current facility-administered medications for this visit.    Allergies as of 07/14/2023   (No Known Allergies)    Past Medical History:  Diagnosis Date   Allergy    Anxiety    Arrhythmia    Asthma    Depression    GERD (gastroesophageal reflux disease)    Thyroid disease    hypo    Past Surgical History:  Procedure Laterality Date   BIOPSY  06/13/2023   Procedure: BIOPSY;  Surgeon: Toney Reil, MD;  Location: [facility] ENDOSCOPY;  Service: Gastroenterology;;   CHOLECYSTECTOMY  2009   COLONOSCOPY WITH PROPOFOL N/A 06/13/2023   Procedure: COLONOSCOPY WITH PROPOFOL;  Surgeon: Toney Reil, MD;  Location: [facility] ENDOSCOPY;  Service: Gastroenterology;  Laterality: N/A;   ESOPHAGOGASTRODUODENOSCOPY (EGD) WITH PROPOFOL N/A 06/13/2023   Procedure: ESOPHAGOGASTRODUODENOSCOPY (EGD)  WITH PROPOFOL;  Surgeon: Toney Reil, MD;  Location: [facility] ENDOSCOPY;  Service: Gastroenterology;  Laterality: N/A;   HEMOSTASIS CLIP PLACEMENT  06/13/2023   Procedure: HEMOSTASIS CLIP PLACEMENT;  Surgeon: Toney Reil, MD;  Location: [facility] ENDOSCOPY;  Service: Gastroenterology;;   POLYPECTOMY  06/13/2023   Procedure: POLYPECTOMY;  Surgeon: Toney Reil, MD;  Location[facility] END[facility]COPY;  Service: Gastroenterology;;    Review of Systems:    All systems reviewed and negative except where noted in HPI.    Physical Examination:   BP 134/79   Pulse (!) 59   Temp 97.7 F (36.5 C)   Ht 5\' 5"  (1.651 m)   Wt (!) 333 lb 12.8 oz (151.4 kg)   LMP 05/23/2023 (Approximate)   BMI 55.55 kg/m   General: Well-nourished, well-developed in no acute distress.  Neuro: Alert and oriented x 3.  Grossly intact.  Psych: Alert and cooperative, normal mood and affect.   Imaging Studies: No results found.  Assessment and Plan:   [name] is a 47 y.o. y/o female returns for follow-up of:  1.  Mild chronic iron deficiency anemia: Recent EGD and colonoscopy unrevealing for sources.  History of menorrhagia, most likely source.  Followed by hematologist Dr. Rennis Harding in [place].  Continue iron tablet daily.  Monitor labs through PCP or hematologist.  2.  Irritable bowel syndrome, with chronic diarrhea; bile salt diarrhea postcholecystectomy 2009.  Recent biopsies negative for celiac and microscopic colitis.  Increase dicyclomine to 20 Mg 3 times daily  Start Cholestyramine, Mix 1 packet in a drink daily to help with diarrhea.  Try OTC IB Guard (pepermint oil) 2 capsules twice daily if dicyclomine does not work.  Low FODMAP diet handout given and discussed.  3.  History of colon polyps  Repeat colonoscopy in 5 years  Celso Amy, PA-C  Follow up As Needed.

## 2023-07-14 ENCOUNTER — Encounter: Payer: Self-pay | Admitting: Physician Assistant

## 2023-07-14 ENCOUNTER — Ambulatory Visit (INDEPENDENT_AMBULATORY_CARE_PROVIDER_SITE_OTHER): Payer: Medicaid Other | Admitting: Physician Assistant

## 2023-07-14 VITALS — BP 134/79 | HR 59 | Temp 97.7°F | Ht 65.0 in | Wt 333.8 lb

## 2023-07-14 DIAGNOSIS — Z860100 Personal history of colon polyps, unspecified: Secondary | ICD-10-CM

## 2023-07-14 DIAGNOSIS — K9089 Other intestinal malabsorption: Secondary | ICD-10-CM | POA: Diagnosis not present

## 2023-07-14 DIAGNOSIS — K580 Irritable bowel syndrome with diarrhea: Secondary | ICD-10-CM | POA: Diagnosis not present

## 2023-07-14 DIAGNOSIS — D509 Iron deficiency anemia, unspecified: Secondary | ICD-10-CM

## 2023-07-14 MED ORDER — DICYCLOMINE HCL 20 MG PO TABS: 20.0000 mg | ORAL_TABLET | Freq: Three times a day (TID) | ORAL | 11 refills | Status: AC | PRN

## 2023-07-14 MED ORDER — CHOLESTYRAMINE 4 G PO PACK: 4.0000 g | PACK | Freq: Every day | ORAL | 11 refills | Status: DC

## 2023-07-18 ENCOUNTER — Encounter: Payer: Self-pay | Admitting: Advanced Practice Midwife

## 2023-07-18 ENCOUNTER — Ambulatory Visit: Payer: Medicaid Other | Admitting: Advanced Practice Midwife

## 2023-07-18 ENCOUNTER — Telehealth: Payer: Medicaid Other | Admitting: Psychiatry

## 2023-07-18 VITALS — BP 131/72 | HR 74 | Ht 68.0 in | Wt 330.1 lb

## 2023-07-18 DIAGNOSIS — Z3202 Encounter for pregnancy test, result negative: Secondary | ICD-10-CM

## 2023-07-18 DIAGNOSIS — N921 Excessive and frequent menstruation with irregular cycle: Secondary | ICD-10-CM | POA: Diagnosis not present

## 2023-07-18 LAB — POCT URINALYSIS DIPSTICK
Bilirubin, UA: NEGATIVE
Blood, UA: NEGATIVE
Glucose, UA: NEGATIVE
Ketones, UA: NEGATIVE
Leukocytes, UA: NEGATIVE
Nitrite, UA: NEGATIVE
Protein, UA: POSITIVE — NL
Spec Grav, UA: 1.025 (ref 1.010–1.025)
Urobilinogen, UA: 0.2 U/dL
pH, UA: 5 (ref 5.0–8.0)

## 2023-07-18 LAB — POCT URINE PREGNANCY: Preg Test, Ur: NEGATIVE

## 2023-07-18 NOTE — Progress Notes (Signed)
Patient ID: Linda Bell, female   DOB: Apr 12, 1976, 47 y.o.   MRN: [id number]  Reason for Consult: Menorrhagia   Referred by Ellis Parents, FNP  Subjective:  HPI:  Linda Bell is a 47 y.o. female being seen to discuss options of treatment for heavy menstrual periods. She has a history of heavy periods that have gotten heavier in recent years. Her periods are usually monthly- occasionally skipping a month. They last 7 days and are heavy the first 4 days. She changes a pad or tampon every 1-2 hours. She also has clots from dime to quarter size. She has cramping that has also worsened in recent years. She has been treated for anemia with 2 iron infusions in recent months.   She reports some peri menopausal hot flashes. Her last PAP was in September of this year and was normal.  She declines hormonal management of heavy periods. She is most interested in endometrial ablation as treatment.    Ob/Gyn history Contraception: Depo use 27 years ago Ob: G1 P1001 at age 63 No known history of fibroids or ovarian cysts Family history of menopause in late 63's  Past Medical History:  Diagnosis Date   Allergy    Anxiety    Arrhythmia    Asthma    Depression    GERD (gastroesophageal reflux disease)    Thyroid disease    hypo   Family History  Problem Relation Age of Onset   Cancer Mother        basal cell (> 1 occurence)   Depression Mother    Cancer Father        renal   Drug abuse Father        PASSED AWAY FROM DRUG OVERDOSE   Mental illness Father    Depression Father    Mental illness Brother        PASSED AWAY FROM DRUG OVERDOSE   Breast cancer Maternal Aunt    Schizophrenia Maternal Grandfather    Prostate cancer Maternal Grandfather    Depression Maternal Grandfather    Mental illness Maternal Grandfather    Heart attack Maternal Grandmother    Heart attack Paternal Grandfather    Depression Paternal Grandmother    Mental illness Cousin    Past Surgical  History:  Procedure Laterality Date   BIOPSY  06/13/2023   Procedure: BIOPSY;  Surgeon: Toney Reil, MD;  Location: [facility] ENDOSCOPY;  Service: Gastroenterology;;   CHOLECYSTECTOMY  2009   COLONOSCOPY WITH PROPOFOL N/A 06/13/2023   Procedure: COLONOSCOPY WITH PROPOFOL;  Surgeon: Toney Reil, MD;  Location: [facility] ENDOSCOPY;  Service: Gastroenterology;  Laterality: N/A;   ESOPHAGOGASTRODUODENOSCOPY (EGD) WITH PROPOFOL N/A 06/13/2023   Procedure: ESOPHAGOGASTRODUODENOSCOPY (EGD) WITH PROPOFOL;  Surgeon: Toney Reil, MD;  Location: [facility] ENDOSCOPY;  Service: Gastroenterology;  Laterality: N/A;   HEMOSTASIS CLIP PLACEMENT  06/13/2023   Procedure: HEMOSTASIS CLIP PLACEMENT;  Surgeon: Toney Reil, MD;  Location: [facility] ENDOSCOPY;  Service: Gastroenterology;;   POLYPECTOMY  06/13/2023   Procedure: POLYPECTOMY;  Surgeon: Toney Reil, MD;  Location: [facility] ENDOSCOPY;  Service: Gastroenterology;;    Short Social History:  Social History   Tobacco Use   Smoking status: Never   Smokeless tobacco: Never   Tobacco comments:    second hand smoke exposure in home as child,  and adult none last 15 years  Substance Use Topics   Alcohol use: No    No Known Allergies  Current Outpatient Medications  Medication Sig Dispense Refill  albuterol (PROVENTIL HFA;VENTOLIN HFA) 108 (90 Base) MCG/ACT inhaler Inhale 1-2 puffs into the lungs every 6 (six) hours as needed for wheezing or shortness of breath. 1 Inhaler 5   cholestyramine (QUESTRAN) 4 g packet Take 1 packet (4 g total) by mouth daily. 30 packet 11   dicyclomine (BENTYL) 20 MG tablet Take 1 tablet (20 mg total) by mouth 3 (three) times daily as needed for spasms (for Abdominal Cramping). 90 tablet 11   FLUoxetine (PROZAC) 20 MG capsule Take 60 mg by mouth daily.     hydrOXYzine (ATARAX/VISTARIL) 25 MG tablet Take 0.5-1 tablets (12.5-25 mg total) by mouth every 8 (eight) hours as needed for anxiety. NEED APPOINTMENT 30  tablet 0   ibuprofen (ADVIL) 100 MG tablet Take by mouth.     levothyroxine (SYNTHROID, LEVOTHROID) 50 MCG tablet TAKE 1 TABLET (50 MCG TOTAL) BY MOUTH DAILY. 30 tablet 0   mirtazapine (REMERON) 7.5 MG tablet Take 1 tablet (7.5 mg total) by mouth at bedtime. 30 tablet 1   omeprazole (PRILOSEC) 40 MG capsule Take 20 mg by mouth once.     No current facility-administered medications for this visit.    Review of Systems  Constitutional:  Negative for chills and fever.  HENT:  Negative for congestion, ear discharge, ear pain, hearing loss, sinus pain and sore throat.   Eyes:  Negative for blurred vision and double vision.  Respiratory:  Negative for cough, shortness of breath and wheezing.   Cardiovascular:  Negative for chest pain, palpitations and leg swelling.  Gastrointestinal:  Positive for abdominal pain. Negative for blood in stool, constipation, diarrhea, heartburn, melena, nausea and vomiting.  Genitourinary:  Negative for dysuria, flank pain, frequency, hematuria and urgency.  Musculoskeletal:  Negative for back pain, joint pain and myalgias.  Skin:  Negative for itching and rash.  Neurological:  Negative for dizziness, tingling, tremors, sensory change, speech change, focal weakness, seizures, loss of consciousness, weakness and headaches.  Endo/Heme/Allergies:  Negative for environmental allergies. Does not bruise/bleed easily.       Positive for heavy menstrual bleeding  Psychiatric/Behavioral:  Negative for depression, hallucinations, memory loss, substance abuse and suicidal ideas. The patient is not nervous/anxious and does not have insomnia.        Objective:  Objective   Vitals:   07/18/23 1403  BP: 131/72  Pulse: 74  Weight: (!) 330 lb 1.6 oz (149.7 kg)  Height: 5\' 8"  (1.727 m)   Body mass index is 50.19 kg/m. Constitutional: obese female in no acute distress.  HEENT: normal Skin: Warm and dry.  Cardiovascular: Regular rate and rhythm.   Respiratory:  Normal  respiratory effort Psych: Alert and Oriented x3. No memory deficits. Normal mood and affect.    Data:  Latest Reference Range & Units 07/18/23 14:20 07/18/23 14:21  Bilirubin, UA   negative  Clarity, UA   clear  Color, UA   dark yellow  Glucose Negative   Negative  Ketones, UA   negative  Leukocytes,UA Negative   Negative  Nitrite, UA   negative  pH, UA 5.0 - 8.0   5.0  Protein,UA Negative   Positive  Specific Gravity, UA 1.010 - 1.025   1.025  Urobilinogen, UA 0.2 or 1.0 E.U./dL  0.2  RBC, UA   negative  Preg Test, Ur Negative  Negative        Assessment/Plan:     47 y.o G1 P48 female with menorrhagia  Return to clinic for visit with MD to discuss and  plan for endometrial ablation   Tresea Mall, CNM [facility] 07/18/2023 3:02 PM

## 2023-07-18 NOTE — Patient Instructions (Signed)
Endometrial Ablation Endometrial ablation is a procedure that destroys the thin inner layer of the lining of the uterus (endometrium). This procedure may be done: To stop heavy menstrual periods. To stop bleeding that is causing anemia. To control irregular bleeding. To treat bleeding caused by small tumors (fibroids) in the endometrium. This procedure is often done as an alternative to major surgery, such as removal of the uterus and cervix (hysterectomy). As a result of this procedure: You may not be able to have children. However, if you have not yet gone through menopause: You may still have a small chance of getting pregnant. You will need to use a reliable method of birth control after the procedure to prevent pregnancy. You may stop having a menstrual period, or you may have only a small amount of bleeding during your period. Menstruation may return several years after the procedure. Tell a health care provider about: Any allergies you have. All medicines you are taking, including vitamins, herbs, eye drops, creams, and over-the-counter medicines. Any problems you or family members have had with the use of anesthetic medicines. Any blood disorders you have. Any surgeries you have had. Any medical conditions you have. Whether you are pregnant or may be pregnant. What are the risks? Generally, this is a safe procedure. However, problems may occur, including: A hole (perforation) in the uterus or bowel. Infection in the uterus, bladder, or vagina. Bleeding. Allergic reaction to medicines. Damage to nearby structures or organs. An air bubble in the lung (air embolus). Problems with pregnancy. Failure of the procedure. Decreased ability to diagnose cancer in the endometrium. Scar tissue forms after the procedure, making it more difficult to get a sample of the uterine lining. What happens before the procedure? Medicines Ask your health care provider about: Changing or stopping your  regular medicines. This is especially important if you take diabetes medicines or blood thinners. Taking medicines such as aspirin and ibuprofen. These medicines can thin your blood. Do not take these medicines before your procedure if your doctor tells you not to take them. Taking over-the-counter medicines, vitamins, herbs, and supplements. Tests You will have tests of your endometrium to make sure there are no precancerous cells or cancer cells present. You may have an ultrasound of the uterus. General instructions Do not use any products that contain nicotine or tobacco for at least 4 weeks before the procedure. These include cigarettes, chewing tobacco, and vaping devices, such as e-cigarettes. If you need help quitting, ask your health care provider. You may be given medicines to thin the endometrium. Ask your health care provider what steps will be taken to help prevent infection. These steps may include: Removing hair at the surgery site. Washing skin with a germ-killing soap. Taking antibiotic medicine. Plan to have a responsible adult take you home from the hospital or clinic. Plan to have a responsible adult care for you for the time you are told after you leave the hospital or clinic. This is important. What happens during the procedure?  You will lie on an exam table with your feet and legs supported as in a pelvic exam. An IV will be inserted into one of your veins. You will be given a medicine to help you relax (sedative). A surgical tool with a light and camera (resectoscope) will be inserted into your vagina and moved into your uterus. This allows your surgeon to see inside your uterus. Endometrial tissue will be destroyed and removed, using one of the following methods: Radiofrequency. This  uses an electrical current to destroy the endometrium. Cryotherapy. This uses extreme cold to freeze the endometrium. Heated fluid. This uses a heated salt and water (saline) solution to  destroy the endometrium. Microwave. This uses high-energy microwaves to heat up the endometrium and destroy it. Thermal balloon. This involves inserting a catheter with a balloon tip into the uterus. The balloon tip is filled with heated fluid to destroy the endometrium. The procedure may vary among health care providers and hospitals. What happens after the procedure? Your blood pressure, heart rate, breathing rate, and blood oxygen level will be monitored until you leave the hospital or clinic. You may have vaginal bleeding for 4-6 weeks after the procedure. You may also have: Cramps. A thin, watery vaginal discharge that is light pink or brown. A need to urinate more than usual. Nausea. If you were given a sedative during the procedure, it can affect you for several hours. Do not drive or operate machinery until your health care provider says that it is safe. Do not have sex or insert anything into your vagina until your health care provider says it is safe. Summary Endometrial ablation is done to treat many causes of heavy menstrual bleeding. The procedure destroys the thin inner layer of the lining of the uterus (endometrium). This procedure is often done as an alternative to major surgery, such as removal of the uterus and cervix (hysterectomy). Plan to have a responsible adult take you home from the hospital or clinic. This information is not intended to replace advice given to you by your health care provider. Make sure you discuss any questions you have with your health care provider. Document Revised: 03/05/2020 Document Reviewed: 03/05/2020 Elsevier Patient Education  2024 [employer].

## 2023-07-31 ENCOUNTER — Ambulatory Visit: Payer: Medicaid Other | Admitting: Obstetrics

## 2023-07-31 ENCOUNTER — Encounter: Payer: Self-pay | Admitting: Obstetrics

## 2023-07-31 VITALS — BP 138/81 | HR 80 | Ht 68.0 in | Wt 328.0 lb

## 2023-07-31 DIAGNOSIS — N939 Abnormal uterine and vaginal bleeding, unspecified: Secondary | ICD-10-CM

## 2023-07-31 MED ORDER — MYFEMBREE 40-1-0.5 MG PO TABS: 1.0000 | ORAL_TABLET | Freq: Every day | ORAL | 12 refills | Status: DC

## 2023-07-31 NOTE — Progress Notes (Signed)
    GYNECOLOGY PROGRESS NOTE  Subjective:  PCP: Ellis Parents, FNP  Patient ID: Linda Bell, female    DOB: 1975-10-14, 47 y.o.   MRN: [id number]  HPI  Patient is a 47 y.o. G43P1001 female who presents for a consult for an ablation after seeing Tresea Mall, CNM. She has a history of heavy periods that have gotten heavier in recent years. Her periods are usually monthly- occasionally skipping a month. They last 7 days and are heavy the first 4 days. She changes a pad or tampon every 1-2 hours. She also has clots from dime to quarter size. She has cramping that has also worsened in recent years. She has been treated for anemia with 2 iron infusions in recent months. She has not had a pelvic US. Prior hormonal contraception has included DMPA in her 51s, caused weight gain so she stopped, and OCPs, which she also states caused weight gain and didn't tolerate well. Is not set on an ablation, is just what CNM recommended and is open to discussing all options. Denies hx of migraines, does not smoke, and no hx of VTE.   The following portions of the patient's history were reviewed and updated as appropriate: allergies, current medications, past family history, past medical history, past social history, past surgical history, and problem list.  Review of Systems Pertinent items are noted in HPI.   Objective:   Blood pressure 138/81, pulse 80, height 5\' 8"  (1.727 m), weight (!) 328 lb (148.8 kg), last menstrual period 07/01/2023. Body mass index is 49.87 kg/m.  General appearance: alert and cooperative Abdomen: soft, non-tender; bowel sounds normal; no masses,  no organomegaly Pelvic: deferred Extremities: extremities normal, atraumatic, no cyanosis or edema Neurologic: Grossly normal   Assessment/Plan:   1. Abnormal uterine bleeding (AUB)   Patient with HMB, cycles are regular and now becoming anemic due to the menorrhagia. Discussed management options for abnormal uterine bleeding  including expectant, NSAIDs, tranexamic acid (Lysteda), oral progesterone (Provera, norethindrone, megace), Depo Provera, Levonorgestrel containing IUD, Orilissa/Myfembree, endometrial ablation or hysterectomy as definitive surgical management.  Discussed risks and benefits of each method.   Final management decision will hinge on results of patient's work up and whether an underlying etiology for the patients bleeding symptoms can be discerned.  We will conduct a basic work up examining using the PALM-COIEN classification system.  In the meantime the patient opts to trial Myfembree while we await results of her ultrasound.  Side effects discussed. Bleeding precautions reviewed.   Total time was 30 minutes. That includes chart review before the visit, the actual patient visit, and time spent on documentation after the visit. Time excludes procedures, if any.    Julieanne Manson, DO [facility]

## 2023-08-01 ENCOUNTER — Other Ambulatory Visit: Payer: Medicaid Other

## 2023-08-11 ENCOUNTER — Encounter: Payer: Self-pay | Admitting: Internal Medicine

## 2023-08-11 ENCOUNTER — Ambulatory Visit: Payer: Medicaid Other | Admitting: Internal Medicine

## 2023-08-11 VITALS — BP 136/86 | HR 78 | Temp 97.9°F | Ht 68.0 in | Wt 333.0 lb

## 2023-08-11 DIAGNOSIS — G4733 Obstructive sleep apnea (adult) (pediatric): Secondary | ICD-10-CM

## 2023-08-11 DIAGNOSIS — R0683 Snoring: Secondary | ICD-10-CM | POA: Diagnosis not present

## 2023-08-11 DIAGNOSIS — J4520 Mild intermittent asthma, uncomplicated: Secondary | ICD-10-CM | POA: Diagnosis not present

## 2023-08-11 DIAGNOSIS — G4719 Other hypersomnia: Secondary | ICD-10-CM | POA: Diagnosis not present

## 2023-08-11 NOTE — Patient Instructions (Signed)
Recommend home sleep study to assess for sleep apnea  Recommend weight loss  Continue to use albuterol as needed  Avoid Allergens and Irritants Avoid secondhand smoke Avoid SICK contacts Recommend  Masking  when appropriate Recommend Keep up-to-date with vaccinations

## 2023-08-11 NOTE — Progress Notes (Signed)
Name: Linda Bell MRN: [id number] DOB: 08/07/1976    CHIEF COMPLAINT:  EXCESSIVE DAYTIME SLEEPINESS Assessment of sleep apnea Assessment of asthma Morbid obesity   HISTORY OF PRESENT ILLNESS: Patient is seen today for problems and issues with sleep related to excessive daytime sleepiness Patient  has been having sleep problems for many years Patient has been having excessive daytime sleepiness for a long time Patient has been having extreme fatigue and tiredness, lack of energy +  very Loud snoring every night + struggling breathe at night and gasps for air + morning headaches + Nonrefreshing sleep  Discussed sleep data and reviewed with patient.  Encouraged proper weight management.  Discussed driving precautions and its relationship with hypersomnolence.  Discussed operating dangerous equipment and its relationship with hypersomnolence.  Discussed sleep hygiene, and benefits of a fixed sleep waked time.  The importance of getting eight or more hours of sleep discussed with patient.  Discussed limiting the use of the computer and television before bedtime.  Decrease naps during the day, so night time sleep will become enhanced.  Limit caffeine, and sleep deprivation.  HTN, stroke, and heart failure are potential risk factors.    EPWORTH SLEEP SCORE 3  Assessment of asthma Patient had asthma attack and flare several weeks ago Responded well to albuterol inhaler Patient is not on any maintenance therapy No exacerbation at this time No evidence of heart failure at this time No evidence or signs of infection at this time No respiratory distress No fevers, chills, nausea, vomiting, diarrhea No evidence of lower extremity edema No evidence hemoptysis  Patient is morbidly obese weighs 333 pounds Recommend weight loss Primary care physician assessing weight loss therapy  PAST MEDICAL HISTORY :   has a past medical history of Allergy, Anxiety, Arrhythmia, Asthma,  Depression, GERD (gastroesophageal reflux disease), and Thyroid disease.  has a past surgical history that includes Cholecystectomy (2009); Colonoscopy with propofol (N/A, 06/13/2023); Esophagogastroduodenoscopy (egd) with propofol (N/A, 06/13/2023); biopsy (06/13/2023); polypectomy (06/13/2023); and Hemostasis clip placement (06/13/2023). Prior to Admission medications   Medication Sig Start Date End Date Taking? Authorizing Provider  albuterol (PROVENTIL HFA;VENTOLIN HFA) 108 (90 Base) MCG/ACT inhaler Inhale 1-2 puffs into the lungs every 6 (six) hours as needed for wheezing or shortness of breath. 03/20/17   Galen Manila, NP  cholestyramine Lanetta Inch) 4 g packet Take 1 packet (4 g total) by mouth daily. 07/14/23 07/08/24  Celso Amy, PA-C  dicyclomine (BENTYL) 20 MG tablet Take 1 tablet (20 mg total) by mouth 3 (three) times daily as needed for spasms (for Abdominal Cramping). 07/14/23 07/08/24  Celso Amy, PA-C  FLUoxetine (PROZAC) 20 MG capsule Take 60 mg by mouth daily. 05/30/23   [provider]  hydrOXYzine (ATARAX/VISTARIL) 25 MG tablet Take 0.5-1 tablets (12.5-25 mg total) by mouth every 8 (eight) hours as needed for anxiety. NEED APPOINTMENT 07/18/18   Galen Manila, NP  ibuprofen (ADVIL) 100 MG tablet Take by mouth. 03/13/14   [provider]  levothyroxine (SYNTHROID, LEVOTHROID) 50 MCG tablet TAKE 1 TABLET (50 MCG TOTAL) BY MOUTH DAILY. 11/18/17   Galen Manila, NP  mirtazapine (REMERON) 7.5 MG tablet Take 1 tablet (7.5 mg total) by mouth at bedtime. 06/28/23   Jomarie Longs, MD  omeprazole (PRILOSEC) 40 MG capsule Take 20 mg by mouth once.    [provider]  Relugolix-Estradiol-Norethind (MYFEMBREE) 40-1-0.5 MG TABS Take 1 tablet by mouth daily. 07/31/23   Julieanne Manson, MD   No Known Allergies  FAMILY  HISTORY:  family history includes Breast cancer in her maternal aunt; Cancer in her father and mother; Depression in her father,  maternal grandfather, mother, and paternal grandmother; Drug abuse in her father; Heart attack in her maternal grandmother and paternal grandfather; Mental illness in her brother, cousin, father, and maternal grandfather; Prostate cancer in her maternal grandfather; Schizophrenia in her maternal grandfather. SOCIAL HISTORY:  reports that she has never smoked. She has never used smokeless tobacco. She reports that she does not drink alcohol and does not use drugs.   Review of Systems:  Gen:  Denies  fever, sweats, chills weight loss  HEENT: Denies blurred vision, double vision, ear pain, eye pain, hearing loss, nose bleeds, sore throat Cardiac:  No dizziness, chest pain or heaviness, chest tightness,edema, No JVD Resp:   No cough, -sputum production, -shortness of breath,-wheezing, -hemoptysis,  Gi: Denies swallowing difficulty, stomach pain, nausea or vomiting, diarrhea, constipation, bowel incontinence Gu:  Denies bladder incontinence, burning urine Ext:   Denies Joint pain, stiffness or swelling Skin: Denies  skin rash, easy bruising or bleeding or hives Endoc:  Denies polyuria, polydipsia , polyphagia or weight change Psych:   Denies depression, insomnia or hallucinations  Other:  All other systems negative   ALL OTHER ROS ARE NEGATIVE BP 136/86 (BP Location: Left Arm, Patient Position: Sitting, Cuff Size: Normal)   Pulse 78   Temp 97.9 F (36.6 C) (Temporal)   Ht 5\' 8"  (1.727 m)   Wt (!) 333 lb (151 kg)   LMP 07/01/2023 (Approximate)   SpO2 97%   BMI 50.63 kg/m     Physical Examination:   General Appearance: No distress  EYES PERRLA, EOM intact.   NECK Supple, No JVD Pulmonary: normal breath sounds, No wheezing.  CardiovascularNormal S1,S2.  No m/r/g.   Abdomen: Benign, Soft, non-tender. Skin:   warm, no rashes, no ecchymosis  Extremities: normal, no cyanosis, clubbing. Neuro:without focal findings,  speech normal  PSYCHIATRIC: Mood, affect within normal  limits.   ALL OTHER ROS ARE NEGATIVE    ASSESSMENT AND PLAN SYNOPSIS  Patient with signs and symptoms of excessive daytime sleepiness with probable underlying diagnosis of obstructive sleep apnea in the setting of obesity and deconditioned state   Recommend Sleep Study for definitve diagnosis  Obesity -recommend significant weight loss -recommend changing diet  Deconditioned state -Recommend increased daily activity and exercise   MEDICATION ADJUSTMENTS/LABS AND TESTS ORDERED: Recommend Sleep Study Recommend weight loss   CURRENT MEDICATIONS REVIEWED AT LENGTH WITH PATIENT TODAY   Patient  satisfied with Plan of action and management. All questions answered  Follow up  3 months  Total Time Spent  45  mins   Wallis Bamberg Santiago Glad, M.D.  [facility]  Medical Director [address] [facility] Medical Director [facility] Cardio-Pulmonary Department

## 2023-08-24 ENCOUNTER — Other Ambulatory Visit: Payer: Medicaid Other

## 2023-08-31 ENCOUNTER — Ambulatory Visit
Admission: RE | Admit: 2023-08-31 | Discharge: 2023-08-31 | Disposition: A | Discharge status: Home / Self Care | Payer: Medicaid Other | Source: Ambulatory Visit | Attending: Obstetrics | Admitting: Obstetrics

## 2023-08-31 DIAGNOSIS — N939 Abnormal uterine and vaginal bleeding, unspecified: Secondary | ICD-10-CM | POA: Diagnosis present

## 2023-09-03 NOTE — Progress Notes (Deleted)
 Linda Bell  [address] [place],  [place]  [address] [phone]  Clinic Day:  09/03/2023  Referring physician: Orlando Dwayne NOVAK, FNP   HISTORY OF PRESENT ILLNESS:  The patient is a 48 y.o. female  who I recently began seeing iron deficiency anemia likely related to heavy menstrual cycles.  She comes in today to reassess her iron and hemoglobin levels after receiving IV iron in October 2024.     iron saturation.  Recent labs showed a hemoglobin of 11.2, with an MCV of 84.  Iron studies done recently showed a ferritin of 34, a serum iron of 45, a TIBC of 347, and a low iron saturation of 13%.  According to the patient, she has had anemia for at least the past year.  For the past 5 months, she has been taking 1 iron pill daily.  She claims her menstrual cycles can intermittently be very heavy, where they last for as long as 7 days at a time, with thick clots being seen within them.  The patient claims she did have a Pap smear/pelvic exam within the past few weeks, which came back unremarkable.  She denies having other overt forms of blood loss.  Of note, she is scheduled for a colonoscopy in 2 weeks.  PAST MEDICAL HISTORY:   Past Medical History:  Diagnosis Date  . Allergy   . Anxiety   . Arrhythmia   . Asthma   . Depression   . GERD (gastroesophageal reflux disease)   . Thyroid disease    hypo    PAST SURGICAL HISTORY:   Past Surgical History:  Procedure Laterality Date  . BIOPSY  06/13/2023   Procedure: BIOPSY;  Surgeon: Unk Corinn Skiff, MD;  Location: [facility] [place] ENDOSCOPY;  Service: Gastroenterology;;  . [place] MAIN  . COLONOSCOPY WITH PROPOFOL  N/A 06/13/2023   Procedure: COLONOSCOPY WITH PROPOFOL ;  Surgeon: Unk Corinn Skiff, MD;  Location: [facility] ENDOSCOPY;  Service: Gastroenterology;  Laterality: N/A;  . ESOPHAGOGASTRODUODENOSCOPY (EGD) WITH PROPOFOL  N/A 06/13/2023   Procedure: ESOPHAGOGASTRODUODENOSCOPY (EGD) WITH PROPOFOL ;   Surgeon: Unk Corinn Skiff, MD;  Location: [facility] ENDOSCOPY;  Service: Gastroenterology;  Laterality: N/A;  . HEMOSTASIS CLIP PLACEMENT  06/13/2023   Procedure: HEMOSTASIS CLIP PLACEMENT;  Surgeon: Unk Corinn Skiff, MD;  Location: [facility] ENDOSCOPY;  Service: Gastroenterology;;  . POLYPECTOMY  06/13/2023   Procedure: POLYPECTOMY;  Surgeon: Unk Corinn Skiff, MD;  Location: [facility] ENDOSCOPY;  Service: Gastroenterology;;    CURRENT MEDICATIONS:   Current Outpatient Medications  Medication Sig Dispense Refill  . albuterol  (PROVENTIL  HFA;VENTOLIN  HFA) 108 (90 Base) MCG/ACT inhaler Inhale 1-2 puffs into the lungs every 6 (six) hours as needed for wheezing or shortness of breath. 1 Inhaler 5  . cholestyramine  (QUESTRAN ) 4 g packet Take 1 packet (4 g total) by mouth daily. 30 packet 11  . dicyclomine  (BENTYL ) 20 MG tablet Take 1 tablet (20 mg total) by mouth 3 (three) times daily as needed for spasms (for Abdominal Cramping). 90 tablet 11  . FLUoxetine  (PROZAC ) 20 MG capsule Take 60 mg by mouth daily.    . hydrOXYzine  (ATARAX /VISTARIL ) 25 MG tablet Take 0.5-1 tablets (12.5-25 mg total) by mouth every 8 (eight) hours as needed for anxiety. NEED APPOINTMENT 30 tablet 0  . ibuprofen (ADVIL) 100 MG tablet Take by mouth.    . levothyroxine  (SYNTHROID , LEVOTHROID) 50 MCG tablet TAKE 1 TABLET (50 MCG TOTAL) BY MOUTH DAILY. 30 tablet 0  . omeprazole (PRILOSEC) 40 MG capsule Take 20  mg by mouth once.    . Relugolix-Estradiol-Norethind (MYFEMBREE ) 40-1-0.5 MG TABS Take 1 tablet by mouth daily. 28 tablet 12   No current facility-administered medications for this visit.   ALLERGIES:  No Known Allergies  FAMILY HISTORY:   Family History  Problem Relation Age of Onset  . Cancer Mother        basal cell (> 1 occurence)  . Depression Mother   . Cancer Father        renal  . Drug abuse Father        PASSED AWAY FROM DRUG OVERDOSE  . Mental illness Father   . Depression Father   . Mental illness  Brother        PASSED AWAY FROM DRUG OVERDOSE  . Breast cancer Maternal Aunt   . Schizophrenia Maternal Grandfather   . Prostate cancer Maternal Grandfather   . Depression Maternal Grandfather   . Mental illness Maternal Grandfather   . Heart attack Maternal Grandmother   . Heart attack Paternal Grandfather   . Depression Paternal Grandmother   . Mental illness Cousin     SOCIAL HISTORY:  The patient was born and raised in [place].  She currently lives in [place].  She is widowed, with 1 child.  She is unemployed.  There is no history of alcoholism or tobacco abuse.  REVIEW OF SYSTEMS:  Review of Systems  Constitutional:  Negative for fatigue and fever.  HENT:   Negative for hearing loss and sore throat.   Eyes:  Negative for eye problems.  Respiratory:  Negative for chest tightness, cough and hemoptysis.   Cardiovascular:  Positive for palpitations. Negative for chest pain.  Gastrointestinal:  Positive for diarrhea and nausea. Negative for abdominal distention, abdominal pain, blood in stool, constipation and vomiting.  Endocrine: Negative for hot flashes.  Genitourinary:  Negative for difficulty urinating, dysuria, frequency, hematuria and nocturia.   Musculoskeletal:  Negative for arthralgias, back pain, gait problem and myalgias.  Skin: Negative.  Negative for itching and rash.  Neurological: Negative.  Negative for dizziness, extremity weakness, gait problem, headaches, light-headedness and numbness.  Hematological: Negative.   Psychiatric/Behavioral:  Positive for decreased concentration. Negative for depression and suicidal ideas. The patient is nervous/anxious.      PHYSICAL EXAM:  There were no vitals taken for this visit. Wt Readings from Last 3 Encounters:  08/11/23 (!) 333 lb (151 kg)  07/31/23 (!) 328 lb (148.8 kg)  07/18/23 (!) 330 lb 1.6 oz (149.7 kg)   There is no height or weight on file to calculate BMI. Performance status (ECOG): 0 -  Asymptomatic Physical Exam Constitutional:      Appearance: Normal appearance. She is obese. She is not ill-appearing.  HENT:     Mouth/Throat:     Mouth: Mucous membranes are moist.     Pharynx: Oropharynx is clear. No oropharyngeal exudate or posterior oropharyngeal erythema.  Cardiovascular:     Rate and Rhythm: Normal rate and regular rhythm.     Heart sounds: No murmur heard.    No friction rub. No gallop.  Pulmonary:     Effort: Pulmonary effort is normal. No respiratory distress.     Breath sounds: Normal breath sounds. No wheezing, rhonchi or rales.  Abdominal:     General: Bowel sounds are normal. There is no distension.     Palpations: Abdomen is soft. There is no mass.     Tenderness: There is no abdominal tenderness.  Musculoskeletal:  General: No swelling.     Right lower leg: No edema.     Left lower leg: No edema.  Lymphadenopathy:     Cervical: No cervical adenopathy.     Upper Body:     Right upper body: No supraclavicular or axillary adenopathy.     Left upper body: No supraclavicular or axillary adenopathy.     Lower Body: No right inguinal adenopathy. No left inguinal adenopathy.  Skin:    General: Skin is warm.     Coloration: Skin is not jaundiced.     Findings: No lesion or rash.  Neurological:     General: No focal deficit present.     Mental Status: She is alert and oriented to person, place, and time. Mental status is at baseline.  Psychiatric:        Mood and Affect: Mood normal.        Behavior: Behavior normal.        Thought Content: Thought content normal.   LABS:      Latest Ref Rng & Units 04/18/2023   10:16 AM 12/02/2016   12:01 AM 01/13/2016    6:31 PM  CBC  WBC 3.4 - 10.8 x10E3/uL 7.5  8.4  6.2   Hemoglobin 11.1 - 15.9 g/dL 88.7  87.7  87.8   Hematocrit 34.0 - 46.6 % 36.8  37.9  35.6   Platelets 150 - 450 x10E3/uL 530  437  365       Latest Ref Rng & Units 01/13/2016    6:31 PM  CMP  Glucose 65 - 99 mg/dL 83   BUN 6 - 20  mg/dL 7   Creatinine 9.55 - 8.99 mg/dL 9.03   Sodium 864 - 854 mmol/L 138   Potassium 3.5 - 5.1 mmol/L 3.2   Chloride 101 - 111 mmol/L 106   CO2 22 - 32 mmol/L 25   Calcium 8.9 - 10.3 mg/dL 8.2     Latest Reference Range & Units 05/31/23 10:00  Iron 28 - 170 ug/dL 43  UIBC ug/dL 646  TIBC 749 - 549 ug/dL 603  Saturation Ratios 10.4 - 31.8 % 11  Ferritin 11 - 307 ng/mL 11    ASSESSMENT & PLAN:  A 48 y.o. female who I was asked to consult upon for iron deficiency anemia.  When evaluating her labs today, essentially all of her iron parameters are on the very low end of normal.  This is despite her taking oral iron for at least the past 5 months.  Based upon this, I will arrange for her to receive IV iron over these next few weeks to rapidly replenish her iron stores and normalize her hemoglobin.  She knows to keep her colonoscopy appointment in the forthcoming weeks.  I will see her back in 3 months to reassess her iron and hemoglobin levels to see how well she responded to her upcoming IV iron.  The patient understands all the plans discussed today and is in agreement with them.  I do appreciate Orlando Dwayne NOVAK, FNP for his new consult.   Joliet Mallozzi DELENA Kerns, MD

## 2023-09-04 ENCOUNTER — Inpatient Hospital Stay: Payer: Medicaid Other | Admitting: Oncology

## 2023-09-04 ENCOUNTER — Inpatient Hospital Stay: Payer: Medicaid Other

## 2023-09-05 ENCOUNTER — Ambulatory Visit: Payer: Medicaid Other

## 2023-09-05 DIAGNOSIS — G4719 Other hypersomnia: Secondary | ICD-10-CM

## 2023-09-05 DIAGNOSIS — R0683 Snoring: Secondary | ICD-10-CM

## 2023-09-13 NOTE — Progress Notes (Signed)
[facility]  [address] [place],  [place]  [address] [phone]  Clinic Day:  09/14/2023  Referring physician: Ellis Parents, FNP   HISTORY OF PRESENT ILLNESS:  The patient is a 48 y.o. female  who I recently began seeing iron deficiency anemia likely related to heavy menstrual cycles.  She comes in today to reassess her iron and hemoglobin levels after receiving IV iron in October 2024.  The patient notes an improvement in how she has felt since receiving her IV iron.  Of note, she was recently placed on agent by her gynecologist which provide to call for early menopause.  This was ultimately done to curtail her heavy menstrual cycles, which has been an issue for years.  PHYSICAL EXAM:  Blood pressure (!) 150/99, pulse 77, temperature 98.2 F (36.8 C), temperature source Oral, resp. rate 16, height 5\' 8"  (1.727 m), weight (!) 327 lb 8 oz (148.6 kg), SpO2 95%. Wt Readings from Last 3 Encounters:  09/14/23 (!) 327 lb 8 oz (148.6 kg)  08/11/23 (!) 333 lb (151 kg)  07/31/23 (!) 328 lb (148.8 kg)   Body mass index is 49.8 kg/m. Performance status (ECOG): 0 - Asymptomatic Physical Exam Constitutional:      Appearance: Normal appearance. She is obese. She is not ill-appearing.  HENT:     Mouth/Throat:     Mouth: Mucous membranes are moist.     Pharynx: Oropharynx is clear. No oropharyngeal exudate or posterior oropharyngeal erythema.  Cardiovascular:     Rate and Rhythm: Normal rate and regular rhythm.     Heart sounds: No murmur heard.    No friction rub. No gallop.  Pulmonary:     Effort: Pulmonary effort is normal. No respiratory distress.     Breath sounds: Normal breath sounds. No wheezing, rhonchi or rales.  Abdominal:     General: Bowel sounds are normal. There is no distension.     Palpations: Abdomen is soft. There is no mass.     Tenderness: There is no abdominal tenderness.  Musculoskeletal:        General: No swelling.      Right lower leg: No edema.     Left lower leg: No edema.  Lymphadenopathy:     Cervical: No cervical adenopathy.     Upper Body:     Right upper body: No supraclavicular or axillary adenopathy.     Left upper body: No supraclavicular or axillary adenopathy.     Lower Body: No right inguinal adenopathy. No left inguinal adenopathy.  Skin:    General: Skin is warm.     Coloration: Skin is not jaundiced.     Findings: No lesion or rash.  Neurological:     General: No focal deficit present.     Mental Status: She is alert and oriented to person, place, and time. Mental status is at baseline.  Psychiatric:        Mood and Affect: Mood normal.        Behavior: Behavior normal.        Thought Content: Thought content normal.   LABS:      Latest Ref Rng & Units 09/14/2023    2:09 PM 04/18/2023   10:16 AM 12/02/2016   12:01 AM  CBC  WBC 4.0 - 10.5 K/uL 7.8  7.5  8.4   Hemoglobin 12.0 - 15.0 g/dL 47.4  25.9  56.3   Hematocrit 36.0 - 46.0 % 39.7  36.8  37.9  Platelets 150 - 400 K/uL 410  530  437       Latest Ref Rng & Units 01/13/2016    6:31 PM  CMP  Glucose 65 - 99 mg/dL 83   BUN 6 - 20 mg/dL 7   Creatinine 1.32 - 4.40 mg/dL 1.02   Sodium 725 - 366 mmol/L 138   Potassium 3.5 - 5.1 mmol/L 3.2   Chloride 101 - 111 mmol/L 106   CO2 22 - 32 mmol/L 25   Calcium 8.9 - 10.3 mg/dL 8.2     Latest Reference Range & Units 09/14/23 14:09  Iron 28 - 170 ug/dL 440  UIBC ug/dL 347  TIBC 425 - 956 ug/dL 387  Saturation Ratios 10.4 - 31.8 % 33 (H)  Ferritin 11 - 307 ng/mL 78  (H): Data is abnormally high  ASSESSMENT & PLAN:  A 48 y.o. female with iron deficiency anemia.  I am pleased with the improvement in both her iron and hemoglobin levels since receiving her IV iron.  Clinically, she appears to be doing well.  I will see her back in 6 months for repeat clinical assessment.  The patient understands all the plans discussed today and is in agreement with them.  Demarion Pondexter Kirby Funk, MD

## 2023-09-14 ENCOUNTER — Inpatient Hospital Stay: Payer: Medicaid Other | Admitting: Oncology

## 2023-09-14 ENCOUNTER — Other Ambulatory Visit: Payer: Self-pay | Admitting: Oncology

## 2023-09-14 ENCOUNTER — Inpatient Hospital Stay: Payer: Medicaid Other | Attending: Oncology

## 2023-09-14 VITALS — BP 150/99 | HR 77 | Temp 98.2°F | Resp 16 | Ht 68.0 in | Wt 327.5 lb

## 2023-09-14 DIAGNOSIS — D500 Iron deficiency anemia secondary to blood loss (chronic): Secondary | ICD-10-CM | POA: Diagnosis not present

## 2023-09-14 DIAGNOSIS — D509 Iron deficiency anemia, unspecified: Secondary | ICD-10-CM | POA: Diagnosis present

## 2023-09-14 LAB — CBC WITH DIFFERENTIAL (CANCER CENTER ONLY)
Abs Immature Granulocytes: 0.02 10*3/uL (ref 0.00–0.07)
Basophils Absolute: 0.1 10*3/uL (ref 0.0–0.1)
Basophils Relative: 1 %
Eosinophils Absolute: 0.5 10*3/uL (ref 0.0–0.5)
Eosinophils Relative: 7 %
HCT: 39.7 % (ref 36.0–46.0)
Hemoglobin: 13.3 g/dL (ref 12.0–15.0)
Immature Granulocytes: 0 %
Lymphocytes Relative: 26 %
Lymphs Abs: 2 10*3/uL (ref 0.7–4.0)
MCH: 28.4 pg (ref 26.0–34.0)
MCHC: 33.5 g/dL (ref 30.0–36.0)
MCV: 84.6 fL (ref 80.0–100.0)
Monocytes Absolute: 0.5 10*3/uL (ref 0.1–1.0)
Monocytes Relative: 6 %
Neutro Abs: 4.7 10*3/uL (ref 1.7–7.7)
Neutrophils Relative %: 60 %
Platelet Count: 410 10*3/uL — ABNORMAL HIGH (ref 150–400)
RBC: 4.69 MIL/uL (ref 3.87–5.11)
RDW: 14.5 % (ref 11.5–15.5)
WBC Count: 7.8 10*3/uL (ref 4.0–10.5)
nRBC: 0 % (ref 0.0–0.2)
nRBC: 0 /100{WBCs}

## 2023-09-14 LAB — IRON AND TIBC
Iron: 111 ug/dL (ref 28–170)
Saturation Ratios: 33 % — ABNORMAL HIGH (ref 10.4–31.8)
TIBC: 342 ug/dL (ref 250–450)
UIBC: 231 ug/dL

## 2023-09-14 LAB — FERRITIN: Ferritin: 78 ng/mL (ref 11–307)

## 2023-09-20 ENCOUNTER — Ambulatory Visit: Payer: Medicaid Other | Admitting: Obstetrics

## 2023-09-20 ENCOUNTER — Encounter: Payer: Self-pay | Admitting: Obstetrics

## 2023-09-20 VITALS — BP 130/90 | Ht 68.0 in | Wt 333.0 lb

## 2023-09-20 DIAGNOSIS — Z79899 Other long term (current) drug therapy: Secondary | ICD-10-CM | POA: Diagnosis not present

## 2023-09-20 DIAGNOSIS — N939 Abnormal uterine and vaginal bleeding, unspecified: Secondary | ICD-10-CM | POA: Diagnosis not present

## 2023-09-20 DIAGNOSIS — N924 Excessive bleeding in the premenopausal period: Secondary | ICD-10-CM | POA: Insufficient documentation

## 2023-09-20 NOTE — Progress Notes (Signed)
    GYNECOLOGY PROGRESS NOTE  Subjective:  PCP: Ellis Parents, FNP  Patient ID: Linda Bell, female    DOB: February 21, 1976, 48 y.o.   MRN: [id number]  HPI  Patient is a 48 y.o. G57P1001 female who presents for follow up from office visit in 07/31/23 for AUB, started on Myfembree and periods have lightened.  Pt reports she has decided not to have the ablation, she can tell the bleeding is getting lighter with the myfembree.  Started the medication on 12/5 and her period started that day also. Lasted until 12/11 and was lighter than usual. Then had an even lighter period from 12/23-27, and again from 1/6 - 9, this was very very light.   The following portions of the patient's history were reviewed and updated as appropriate: allergies, current medications, past family history, past medical history, past social history, past surgical history, and problem list.  Review of Systems Pertinent items are noted in HPI.   Objective:   Blood pressure (!) 130/90, height 5\' 8"  (1.727 m), weight (!) 333 lb (151 kg), last menstrual period 09/04/2023. Body mass index is 50.63 kg/m.  General appearance: alert and cooperative, obese Respiratory: normal work of breathing Extremities: extremities normal, atraumatic, no cyanosis or edema Neurologic: Grossly normal  Ultrasound:08/31/23 The uterus is anteverted in position and measures 8.9 x 4.5 x 4.8 cm. It demonstrates a normal, homogeneous echotexture. The endometrium measures 0.5 cm and demonstrates a normal homogeneous echotexture. Nabothian cysts are visualized within the cervix.  Assessment/Plan:   1. Abnormal uterine bleeding   2. Excessive bleeding in premenopausal period   3. Medication management   Pt's symptoms improving on Myfembree, continue taking as directed.  -Check CMP at 3 month mark -Follow up appointment after taking for 6 mos total, sooner with other concerns    Julieanne Manson, DO [facility]

## 2023-10-11 ENCOUNTER — Ambulatory Visit: Payer: Medicaid Other | Admitting: Internal Medicine

## 2023-10-11 ENCOUNTER — Encounter: Payer: Self-pay | Admitting: Internal Medicine

## 2023-10-11 VITALS — BP 126/88 | HR 70 | Temp 97.6°F | Ht 68.0 in | Wt 328.8 lb

## 2023-10-11 DIAGNOSIS — G4733 Obstructive sleep apnea (adult) (pediatric): Secondary | ICD-10-CM

## 2023-10-11 NOTE — Progress Notes (Signed)
Name: Linda Bell MRN: [id number] DOB: 1976/04/08    CHIEF COMPLAINT:  Follow-up assessment for OSA  Follow-up assessment for asthma     HISTORY OF PRESENT ILLNESS: Regarding OSA Home sleep study performed January 2025 shows AHI of 19 Diagnosis of moderate sleep apnea  Discussed sleep data and reviewed with patient.  Encouraged proper weight management.  Discussed driving precautions and its relationship with hypersomnolence.  Discussed operating dangerous equipment and its relationship with hypersomnolence.  Discussed sleep hygiene, and benefits of a fixed sleep waked time.  The importance of getting eight or more hours of sleep discussed with patient.  Discussed limiting the use of the computer and television before bedtime.  Decrease naps during the day, so night time sleep will become enhanced.  Limit caffeine, and sleep deprivation.  HTN, stroke, and heart failure are potential risk factors.   Patient had asthma attack and flare several weeks ago Responded well to albuterol inhaler Patient is not on any maintenance therapy No exacerbation at this time No evidence of heart failure at this time No evidence or signs of infection at this time No respiratory distress No fevers, chills, nausea, vomiting, diarrhea No evidence of lower extremity edema No evidence hemoptysis   Patient is morbidly obese initial start weight 333 pounds Recommend weight loss current weight 328 Primary care physician assessing weight loss therapy  PAST MEDICAL HISTORY :   has a past medical history of Allergy, Anxiety, Arrhythmia, Asthma, Depression, GERD (gastroesophageal reflux disease), and Thyroid disease.  has a past surgical history that includes Cholecystectomy (2009); Colonoscopy with propofol (N/A, 06/13/2023); Esophagogastroduodenoscopy (egd) with propofol (N/A, 06/13/2023); biopsy (06/13/2023); polypectomy (06/13/2023); and Hemostasis clip placement (06/13/2023). Prior to  Admission medications   Medication Sig Start Date End Date Taking? Authorizing Provider  albuterol (PROVENTIL HFA;VENTOLIN HFA) 108 (90 Base) MCG/ACT inhaler Inhale 1-2 puffs into the lungs every 6 (six) hours as needed for wheezing or shortness of breath. 03/20/17   Galen Manila, NP  cholestyramine Lanetta Inch) 4 g packet Take 1 packet (4 g total) by mouth daily. 07/14/23 07/08/24  Celso Amy, PA-C  dicyclomine (BENTYL) 20 MG tablet Take 1 tablet (20 mg total) by mouth 3 (three) times daily as needed for spasms (for Abdominal Cramping). 07/14/23 07/08/24  Celso Amy, PA-C  FLUoxetine (PROZAC) 20 MG capsule Take 60 mg by mouth daily. 05/30/23   [provider]  hydrOXYzine (ATARAX/VISTARIL) 25 MG tablet Take 0.5-1 tablets (12.5-25 mg total) by mouth every 8 (eight) hours as needed for anxiety. NEED APPOINTMENT 07/18/18   Galen Manila, NP  ibuprofen (ADVIL) 100 MG tablet Take by mouth. 03/13/14   [provider]  levothyroxine (SYNTHROID, LEVOTHROID) 50 MCG tablet TAKE 1 TABLET (50 MCG TOTAL) BY MOUTH DAILY. 11/18/17   Galen Manila, NP  mirtazapine (REMERON) 7.5 MG tablet Take 1 tablet (7.5 mg total) by mouth at bedtime. 06/28/23   Jomarie Longs, MD  omeprazole (PRILOSEC) 40 MG capsule Take 20 mg by mouth once.    [provider]  Relugolix-Estradiol-Norethind (MYFEMBREE) 40-1-0.5 MG TABS Take 1 tablet by mouth daily. 07/31/23   Julieanne Manson, MD   No Known Allergies  FAMILY HISTORY:  family history includes Breast cancer in her maternal aunt; Cancer in her father and mother; Depression in her father, maternal grandfather, mother, and paternal grandmother; Drug abuse in her father; Heart attack in her maternal grandmother and paternal grandfather; Mental illness in her brother, cousin, father, and maternal grandfather; Prostate cancer in her maternal  grandfather; Schizophrenia in her maternal grandfather. SOCIAL HISTORY:  reports that she has  never smoked. She has never used smokeless tobacco. She reports that she does not drink alcohol and does not use drugs.  BP 126/88 (BP Location: Left Arm, Patient Position: Sitting, Cuff Size: Normal)   Pulse 70   Temp 97.6 F (36.4 C) (Temporal)   Ht 5\' 8"  (1.727 m)   Wt (!) 328 lb 12.8 oz (149.1 kg)   LMP 09/04/2023   SpO2 97%   BMI 49.99 kg/m    Review of Systems: Gen:  Denies  fever, sweats, chills weight loss  HEENT: Denies blurred vision, double vision, ear pain, eye pain, hearing loss, nose bleeds, sore throat Cardiac:  No dizziness, chest pain or heaviness, chest tightness,edema, No JVD Resp:   No cough, -sputum production, -shortness of breath,-wheezing, -hemoptysis,  Other:  All other systems negative   Physical Examination:   General Appearance: No distress  EYES PERRLA, EOM intact.   NECK Supple, No JVD Pulmonary: normal breath sounds, No wheezing.  CardiovascularNormal S1,S2.  No m/r/g.   Abdomen: Benign, Soft, non-tender. Neurology UE/LE 5/5 strength, no focal deficits Ext pulses intact, cap refill intact ALL OTHER ROS ARE NEGATIVE  ASSESSMENT AND PLAN SYNOPSIS  48 year old morbidly obese female with moderate obstructive sleep apnea in the setting of morbid obesity and deconditioned state with underlying reactive airways disease consistent with asthma  Assessment of OSA Referral to DME company [id number] MASK Start AUTO CPAP 4-12 cm h20  Assessment of asthma Mild intermittent Uses albuterol as needed Strongly recommend weight loss  Obesity -recommend significant weight loss -recommend changing diet  Deconditioned state -Recommend increased daily activity and exercise    MEDICATION ADJUSTMENTS/LABS AND TESTS ORDERED: Referral to DME company [id number] MASK Start AUTO CPAP 4-12 cm h20   CURRENT MEDICATIONS REVIEWED AT LENGTH WITH PATIENT TODAY   Patient  satisfied with Plan of action and management.  All questions answered   Follow up 6 weeks   I spent a total of 45 minutes reviewing chart data, face-to-face evaluation with the patient, counseling and coordination of care as detailed above.      Lucie Leather, M.D.  [facility]  Medical Director [facility] Medical Director [facility] Cardio-Pulmonary Department

## 2023-10-11 NOTE — Patient Instructions (Signed)
Referral to DME company [id number] MASK Start AUTO CPAP 4-12 cm h20

## 2023-11-13 ENCOUNTER — Encounter: Payer: Self-pay | Admitting: Internal Medicine

## 2023-11-13 NOTE — Telephone Encounter (Signed)
 I have sent urgent message to  [employer] getting them to check on this issue

## 2023-11-13 NOTE — Telephone Encounter (Signed)
 CPAP order was placed on 2/12.Please check on the status. Thank you!

## 2023-11-17 ENCOUNTER — Other Ambulatory Visit: Payer: Medicaid Other

## 2023-11-17 DIAGNOSIS — N939 Abnormal uterine and vaginal bleeding, unspecified: Secondary | ICD-10-CM

## 2023-11-17 DIAGNOSIS — Z79899 Other long term (current) drug therapy: Secondary | ICD-10-CM

## 2023-11-17 DIAGNOSIS — N924 Excessive bleeding in the premenopausal period: Secondary | ICD-10-CM

## 2023-11-18 LAB — COMPREHENSIVE METABOLIC PANEL
ALT: 17 IU/L (ref 0–32)
AST: 26 IU/L (ref 0–40)
Albumin: 3.9 g/dL (ref 3.9–4.9)
Alkaline Phosphatase: 80 IU/L (ref 44–121)
BUN/Creatinine Ratio: 13 (ref 9–23)
BUN: 12 mg/dL (ref 6–24)
Bilirubin Total: 0.6 mg/dL (ref 0.0–1.2)
CO2: 21 mmol/L (ref 20–29)
Calcium: 8.9 mg/dL (ref 8.7–10.2)
Chloride: 103 mmol/L (ref 96–106)
Creatinine, Ser: 0.95 mg/dL (ref 0.57–1.00)
Globulin, Total: 3.1 g/dL (ref 1.5–4.5)
Glucose: 101 mg/dL — ABNORMAL HIGH (ref 70–99)
Potassium: 4.2 mmol/L (ref 3.5–5.2)
Sodium: 137 mmol/L (ref 134–144)
Total Protein: 7 g/dL (ref 6.0–8.5)
eGFR: 74 mL/min/{1.73_m2} (ref >59)

## 2023-11-20 ENCOUNTER — Encounter: Payer: Self-pay | Admitting: Obstetrics

## 2023-12-04 NOTE — Telephone Encounter (Signed)
 I received message from [place] with [employer]  New, Elige Radon   Order re-recieved and entered for STAT review and procesing.

## 2023-12-18 ENCOUNTER — Ambulatory Visit: Payer: Self-pay | Admitting: Surgery

## 2023-12-18 NOTE — H&P (Addendum)
 Subjective:   CC: [name]  HPI:  Linda Bell is a 48 y.o. 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 First noted 15 years ago.  Symptoms include: Pain is intermittent, exacerbated with pressure. Often change in size, patient reports they get bigger and then shrink of their own. No history of infection to this areas. No fevers, chills, or unintended weight loss or weight gain.    Past Medical History:  has no past medical history on file.  Past Surgical History:  has no past surgical history on file.  Family History: family history is not on file.  Social History:  reports that she has never smoked. She has never used smokeless tobacco. She reports that she does not drink alcohol and does not use drugs.  Current Medications: has a current medication list which includes the following prescription(s): dicyclomine , fluoxetine , hydroxyzine , levothyroxine , myfembree , ventolin  hfa, wegovy, and acetaminophen-codeine.  Allergies:  No Known Allergies  ROS:  A 15 point review of systems was performed and pertinent positives and negatives noted in HPI   Objective:     BP 126/77   Pulse 65   Ht 172.7 cm (5\' 8" )   Wt (!) 147.4 kg (325 lb)   LMP 12/04/2023   BMI 49.42 kg/m   Constitutional :  No distress, cooperative, alert                           HEENT:  Pilar cyst noted on left parietal scalp and crown of scalp. Soft, mobile and non-tender to touch.            Musculoskeletal: Steady gait and movement  Skin: Cool and moist  Psychiatric: Normal affect, non-agitated, not confused         LABS:  No labs.   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 Will proceed with excision of both cysts in OR due to easy bleed of scalp.   Plan:     1. Pilar cyst of scalp x 2. Discussed surgical excision.  Alternatives  include continued observation.  Benefits include possible symptom relief, pathologic evaluation, improved cosmesis. Discussed the risk of surgery including recurrence, chronic pain, post-op infxn, poor cosmesis, poor/delayed wound healing, and possible re-operation to address said risks. The risks of general anesthetic, if used, includes MI, CVA, sudden death or even reaction to anesthetic medications also discussed.  Typical post-op recovery time of 3-5 days with possible activity restrictions were also discussed.  The patient verbalized understanding and all questions were answered to the patient's satisfaction.  2. Patient has elected to proceed with surgical treatment. Procedure will be scheduled. PRONE POSITION  labs/images/medications/previous chart entries reviewed personally and relevant changes/updates noted above.

## 2023-12-18 NOTE — H&P (View-Only) (Signed)
 Subjective:   CC: Linda Bell  HPI:  Linda Bell is a 48 y.o. female who was referred by Nicholas Bari for evaluation of two Linda cyst, one located on left parietal lobe and the other located on crown of scalp. First noted 15 years ago.  Symptoms include: Pain is intermittent, exacerbated with pressure. Often change in size, patient reports they get bigger and then shrink of their own. No history of infection to this areas. No fevers, chills, or unintended weight loss or weight gain.    Past Medical History:  has no past medical history on file.  Past Surgical History:  has no past surgical history on file.  Family History: family history is not on file.  Social History:  reports that she has never smoked. She has never used smokeless tobacco. She reports that she does not drink alcohol and does not use drugs.  Current Medications: has a current medication list which includes the following prescription(s): dicyclomine , fluoxetine , hydroxyzine , levothyroxine , myfembree , ventolin  hfa, wegovy, and acetaminophen-codeine.  Allergies:  No Known Allergies  ROS:  A 15 point review of systems was performed and pertinent positives and negatives noted in HPI   Objective:     BP 126/77   Pulse 65   Ht 172.7 cm (5\' 8" )   Wt (!) 147.4 kg (325 lb)   LMP 12/04/2023   BMI 49.42 kg/m   Constitutional :  No distress, cooperative, alert                           HEENT:  Linda cyst noted on left parietal scalp and crown of scalp. Soft, mobile and non-tender to touch.            Musculoskeletal: Steady gait and movement  Skin: Cool and moist  Psychiatric: Normal affect, non-agitated, not confused         LABS:  No labs.   RADS: No imaging  Assessment:     Linda cyst (L72.11): No signs of infection on physical exam. Will proceed with excision of both Bell in OR due to easy bleed of scalp.   Plan:     1. Linda cyst of scalp x 2. Discussed surgical excision.  Alternatives  include continued observation.  Benefits include possible symptom relief, pathologic evaluation, improved cosmesis. Discussed the risk of surgery including recurrence, chronic pain, post-op infxn, poor cosmesis, poor/delayed wound healing, and possible re-operation to address said risks. The risks of general anesthetic, if used, includes MI, CVA, sudden death or even reaction to anesthetic medications also discussed.  Typical post-op recovery time of 3-5 days with possible activity restrictions were also discussed.  The patient verbalized understanding and all questions were answered to the patient's satisfaction.  2. Patient has elected to proceed with surgical treatment. Procedure will be scheduled. PRONE POSITION  labs/images/medications/previous chart entries reviewed personally and relevant changes/updates noted above.

## 2024-01-08 ENCOUNTER — Encounter
Admission: RE | Admit: 2024-01-08 | Discharge: 2024-01-08 | Disposition: A | Discharge status: Home / Self Care | Source: Ambulatory Visit | Attending: Surgery | Admitting: Surgery

## 2024-01-08 ENCOUNTER — Other Ambulatory Visit: Payer: Self-pay

## 2024-01-08 VITALS — Ht 68.0 in | Wt 316.0 lb

## 2024-01-08 DIAGNOSIS — Z01812 Encounter for preprocedural laboratory examination: Secondary | ICD-10-CM

## 2024-01-08 DIAGNOSIS — N926 Irregular menstruation, unspecified: Secondary | ICD-10-CM

## 2024-01-08 HISTORY — DX: Sleep apnea, unspecified: G47.30

## 2024-01-08 HISTORY — DX: Hypothyroidism, unspecified: E03.9

## 2024-01-08 NOTE — Patient Instructions (Addendum)
 Your procedure is scheduled on:   THURSDAY MAY 15  Report to the Registration Desk on the 1st floor of the [facility]. To find out your arrival time, please call [phone] between 1PM - 3PM on:  [facility] MAY 14  If your arrival time is 6:00 am, do not arrive before that time as the [facility] entrance doors do not open until 6:00 am.  REMEMBER: Instructions that are not followed completely may result in serious medical risk, up to and including death; or upon the discretion of your surgeon and anesthesiologist your surgery may need to be rescheduled.  Do not eat food after midnight the night before surgery.  No gum chewing or hard candies.  You may however, drink CLEAR liquids up to 2 hours before you are scheduled to arrive for your surgery. Do not drink anything within 2 hours of your scheduled arrival time.  Clear liquids include: - water  - apple juice without pulp - gatorade (not RED colors) - black coffee or tea (Do NOT add milk or creamers to the coffee or tea) Do NOT drink anything that is not on this list.   One week prior to surgery: Stop Anti-inflammatories (NSAIDS) such as Advil, Aleve, Ibuprofen, Motrin, Naproxen, Naprosyn and Aspirin based products such as Excedrin, Goody's Powder, BC Powder. Stop ANY OVER THE COUNTER supplements until after surgery.  You may however, continue to take Tylenol if needed for pain up until the day of surgery. WEGOVY hold for 7 days prior to surgery, last dose [facility] MAY 7   Continue taking all of your other prescription medications up until the day of surgery.  ON THE DAY OF SURGERY ONLY TAKE THESE MEDICATIONS WITH SIPS OF WATER:  levothyroxine  (SYNTHROID , LEVOTHROID)  omeprazole (PRILOSEC)   Use inhalers on the day of surgery and bring to the hospital. albuterol  (PROVENTIL  HFA;VENTOLIN  HFA)   No Alcohol for 24 hours before or after surgery.  No Smoking including e-cigarettes for 24 hours before surgery.  No chewable  tobacco products for at least 6 hours before surgery.  No nicotine patches on the day of surgery.  Do not use any "recreational" drugs for at least a week (preferably 2 weeks) before your surgery.  Please be advised that the combination of cocaine and anesthesia may have negative outcomes, up to and including death. If you test positive for cocaine, your surgery will be cancelled.  On the morning of surgery brush your teeth with toothpaste and water, you may rinse your mouth with mouthwash if you wish. Do not swallow any toothpaste or mouthwash.  Do not wear jewelry, make-up, hairpins, clips or nail polish.  For welded (permanent) jewelry: bracelets, anklets, waist bands, etc.  Please have this removed prior to surgery.  If it is not removed, there is a chance that hospital personnel will need to cut it off on the day of surgery.  Do not wear lotions, powders, or perfumes.   Do not shave body hair from the neck down 48 hours before surgery.  Do not bring valuables to the hospital. [facility] is not responsible for any missing/lost belongings or valuables.   Notify your doctor if there is any change in your medical condition (cold, fever, infection).  Wear comfortable clothing (specific to your surgery type) to the hospital.  After surgery, you can help prevent lung complications by doing breathing exercises.  Take deep breaths and cough every 1-2 hours.   If you are being discharged the day of surgery,  you will not be allowed to drive home. You will need a responsible individual to drive you home and stay with you for 24 hours after surgery.   If you are taking public transportation, you will need to have a responsible individual with you.  Please call the Pre-admissions Testing Dept. at [phone] if you have any questions about these instructions.  Surgery Visitation Policy:  Patients having surgery or a procedure may have two visitors.  Children under the age of 49 must  have an adult with them who is not the patient.

## 2024-01-11 ENCOUNTER — Other Ambulatory Visit: Payer: Self-pay

## 2024-01-11 ENCOUNTER — Encounter
Admission: RE | Disposition: A | Discharge status: Home / Self Care | Payer: Self-pay | Source: Home / Self Care | Attending: Surgery

## 2024-01-11 ENCOUNTER — Encounter: Payer: Self-pay | Admitting: Surgery

## 2024-01-11 ENCOUNTER — Ambulatory Visit: Admitting: Registered Nurse

## 2024-01-11 ENCOUNTER — Ambulatory Visit
Admission: RE | Admit: 2024-01-11 | Discharge: 2024-01-11 | Disposition: A | Discharge status: Home / Self Care | Attending: Surgery | Admitting: Surgery

## 2024-01-11 DIAGNOSIS — N926 Irregular menstruation, unspecified: Secondary | ICD-10-CM

## 2024-01-11 DIAGNOSIS — G4733 Obstructive sleep apnea (adult) (pediatric): Secondary | ICD-10-CM | POA: Insufficient documentation

## 2024-01-11 DIAGNOSIS — E66813 Obesity, class 3: Secondary | ICD-10-CM | POA: Insufficient documentation

## 2024-01-11 DIAGNOSIS — Z87891 Personal history of nicotine dependence: Secondary | ICD-10-CM | POA: Diagnosis not present

## 2024-01-11 DIAGNOSIS — K219 Gastro-esophageal reflux disease without esophagitis: Secondary | ICD-10-CM | POA: Diagnosis not present

## 2024-01-11 DIAGNOSIS — Z01812 Encounter for preprocedural laboratory examination: Secondary | ICD-10-CM

## 2024-01-11 DIAGNOSIS — L7211 Pilar cyst: Secondary | ICD-10-CM | POA: Insufficient documentation

## 2024-01-11 DIAGNOSIS — Z6841 Body Mass Index (BMI) 40.0 and over, adult: Secondary | ICD-10-CM | POA: Insufficient documentation

## 2024-01-11 LAB — POCT PREGNANCY, URINE: Preg Test, Ur: NEGATIVE

## 2024-01-11 SURGERY — EXCISION, LESION, SCALP
Anesthesia: General | Site: Scalp | Laterality: Bilateral

## 2024-01-11 MED ORDER — BUPIVACAINE-EPINEPHRINE (PF) 0.5% -1:200000 IJ SOLN
INTRAMUSCULAR | Status: AC
  Filled 2024-01-11: qty 30

## 2024-01-11 MED ORDER — CEFAZOLIN SODIUM-DEXTROSE 2-4 GM/100ML-% IV SOLN
INTRAVENOUS | Status: AC
  Filled 2024-01-11: qty 100

## 2024-01-11 MED ORDER — EPHEDRINE SULFATE-NACL 50-0.9 MG/10ML-% IV SOSY
PREFILLED_SYRINGE | INTRAVENOUS | Status: DC | PRN
  Administered 2024-01-11: 10 mg via INTRAVENOUS

## 2024-01-11 MED ORDER — ACETAMINOPHEN 325 MG PO TABS: 650.0000 mg | ORAL_TABLET | Freq: Three times a day (TID) | ORAL | 0 refills | Status: AC | PRN

## 2024-01-11 MED ORDER — CHLORHEXIDINE GLUCONATE 0.12 % MT SOLN
15.0000 mL | Freq: Once | OROMUCOSAL | Status: AC
  Administered 2024-01-11: 15 mL via OROMUCOSAL

## 2024-01-11 MED ORDER — OXYCODONE-ACETAMINOPHEN 5-325 MG PO TABS: 1.0000 | ORAL_TABLET | Freq: Three times a day (TID) | ORAL | 0 refills | Status: DC | PRN

## 2024-01-11 MED ORDER — ORAL CARE MOUTH RINSE: 15.0000 mL | Freq: Once | OROMUCOSAL | Status: AC

## 2024-01-11 MED ORDER — SUCCINYLCHOLINE CHLORIDE 200 MG/10ML IV SOSY
PREFILLED_SYRINGE | INTRAVENOUS | Status: DC | PRN
  Administered 2024-01-11: 160 mg via INTRAVENOUS

## 2024-01-11 MED ORDER — LACTATED RINGERS IV SOLN: INTRAVENOUS | Status: DC

## 2024-01-11 MED ORDER — PROPOFOL 10 MG/ML IV BOLUS
INTRAVENOUS | Status: AC
  Filled 2024-01-11: qty 20

## 2024-01-11 MED ORDER — CEFAZOLIN SODIUM-DEXTROSE 2-4 GM/100ML-% IV SOLN
2.0000 g | INTRAVENOUS | Status: AC
  Administered 2024-01-11: 3 g via INTRAVENOUS

## 2024-01-11 MED ORDER — MIDAZOLAM HCL 5 MG/5ML IJ SOLN
INTRAMUSCULAR | Status: DC | PRN
  Administered 2024-01-11: 2 mg via INTRAVENOUS

## 2024-01-11 MED ORDER — EPHEDRINE 5 MG/ML INJ
INTRAVENOUS | Status: AC
  Filled 2024-01-11: qty 5

## 2024-01-11 MED ORDER — LIDOCAINE HCL (CARDIAC) PF 100 MG/5ML IV SOSY
PREFILLED_SYRINGE | INTRAVENOUS | Status: DC | PRN
Start: 2024-01-11 — End: 2024-01-11
  Administered 2024-01-11: 50 mg via INTRATRACHEAL

## 2024-01-11 MED ORDER — MIDAZOLAM HCL 2 MG/2ML IJ SOLN
INTRAMUSCULAR | Status: AC
  Filled 2024-01-11: qty 2

## 2024-01-11 MED ORDER — FENTANYL CITRATE (PF) 100 MCG/2ML IJ SOLN: 25.0000 ug | INTRAMUSCULAR | Status: DC | PRN

## 2024-01-11 MED ORDER — 0.9 % SODIUM CHLORIDE (POUR BTL) OPTIME
TOPICAL | Status: DC | PRN
  Administered 2024-01-11: 500 mL

## 2024-01-11 MED ORDER — SODIUM CHLORIDE (PF) 0.9 % IJ SOLN
INTRAMUSCULAR | Status: AC
  Filled 2024-01-11: qty 10

## 2024-01-11 MED ORDER — ACETAMINOPHEN 10 MG/ML IV SOLN
INTRAVENOUS | Status: DC | PRN
  Administered 2024-01-11: 1000 mg via INTRAVENOUS

## 2024-01-11 MED ORDER — CEFAZOLIN SODIUM 1 G IJ SOLR
INTRAMUSCULAR | Status: AC
  Filled 2024-01-11: qty 10

## 2024-01-11 MED ORDER — FENTANYL CITRATE (PF) 100 MCG/2ML IJ SOLN
INTRAMUSCULAR | Status: DC | PRN
  Administered 2024-01-11 (×2): 50 ug via INTRAVENOUS

## 2024-01-11 MED ORDER — PROPOFOL 500 MG/50ML IV EMUL
INTRAVENOUS | Status: DC | PRN
  Administered 2024-01-11: 200 mg via INTRAVENOUS

## 2024-01-11 MED ORDER — DROPERIDOL 2.5 MG/ML IJ SOLN: 0.6250 mg | Freq: Once | INTRAMUSCULAR | Status: DC | PRN

## 2024-01-11 MED ORDER — FENTANYL CITRATE (PF) 100 MCG/2ML IJ SOLN
INTRAMUSCULAR | Status: AC
  Filled 2024-01-11: qty 2

## 2024-01-11 MED ORDER — LIDOCAINE HCL (PF) 2 % IJ SOLN
INTRAMUSCULAR | Status: AC
  Filled 2024-01-11: qty 5

## 2024-01-11 MED ORDER — ONDANSETRON HCL 4 MG/2ML IJ SOLN
INTRAMUSCULAR | Status: DC | PRN
  Administered 2024-01-11: 4 mg via INTRAVENOUS

## 2024-01-11 MED ORDER — ACETAMINOPHEN 10 MG/ML IV SOLN
INTRAVENOUS | Status: AC
  Filled 2024-01-11: qty 100

## 2024-01-11 MED ORDER — DEXAMETHASONE SODIUM PHOSPHATE 10 MG/ML IJ SOLN
INTRAMUSCULAR | Status: AC
  Filled 2024-01-11: qty 1

## 2024-01-11 MED ORDER — ROCURONIUM BROMIDE 100 MG/10ML IV SOLN
INTRAVENOUS | Status: DC | PRN
  Administered 2024-01-11: 10 mg via INTRAVENOUS

## 2024-01-11 MED ORDER — LIDOCAINE-EPINEPHRINE (PF) 1 %-1:200000 IJ SOLN
INTRAMUSCULAR | Status: DC | PRN
  Administered 2024-01-11: 17 mL via SURGICAL_CAVITY

## 2024-01-11 MED ORDER — DOCUSATE SODIUM 100 MG PO CAPS
100.0000 mg | ORAL_CAPSULE | Freq: Two times a day (BID) | ORAL | 0 refills | Status: AC | PRN
Start: 2024-01-11 — End: 2024-01-21

## 2024-01-11 MED ORDER — DEXAMETHASONE SODIUM PHOSPHATE 10 MG/ML IJ SOLN
INTRAMUSCULAR | Status: DC | PRN
  Administered 2024-01-11: 10 mg via INTRAVENOUS

## 2024-01-11 MED ORDER — LIDOCAINE-EPINEPHRINE (PF) 1 %-1:200000 IJ SOLN
INTRAMUSCULAR | Status: AC
  Filled 2024-01-11: qty 30

## 2024-01-11 MED ORDER — ONDANSETRON HCL 4 MG/2ML IJ SOLN
INTRAMUSCULAR | Status: AC
  Filled 2024-01-11: qty 2

## 2024-01-11 MED ORDER — CHLORHEXIDINE GLUCONATE CLOTH 2 % EX PADS
6.0000 | MEDICATED_PAD | Freq: Once | CUTANEOUS | Status: DC
Start: 2024-01-11 — End: 2024-01-11

## 2024-01-11 MED ORDER — CHLORHEXIDINE GLUCONATE 0.12 % MT SOLN
OROMUCOSAL | Status: AC
  Filled 2024-01-11: qty 15

## 2024-01-11 SURGICAL SUPPLY — 26 items
BLADE SURG 15 STRL LF DISP TIS (BLADE) ×1 IMPLANT
DERMABOND ADVANCED .7 DNX12 (GAUZE/BANDAGES/DRESSINGS) ×1 IMPLANT
DRAPE LAPAROTOMY 77X122 PED (DRAPES) ×1 IMPLANT
ELECTRODE REM PT RTRN 9FT ADLT (ELECTROSURGICAL) ×1 IMPLANT
GAUZE 4X4 16PLY ~~LOC~~+RFID DBL (SPONGE) ×1 IMPLANT
GLOVE BIOGEL PI IND STRL 7.0 (GLOVE) ×1 IMPLANT
GLOVE SURG SYN 6.5 ES PF (GLOVE) ×3 IMPLANT
GLOVE SURG SYN 6.5 PF PI (GLOVE) ×3 IMPLANT
GOWN STRL REUS W/ TWL LRG LVL3 (GOWN DISPOSABLE) ×3 IMPLANT
KIT TURNOVER KIT A (KITS) ×1 IMPLANT
LABEL OR SOLS (LABEL) ×1 IMPLANT
MANIFOLD NEPTUNE II (INSTRUMENTS) ×1 IMPLANT
NDL HYPO 22X1.5 SAFETY MO (MISCELLANEOUS) ×1 IMPLANT
NEEDLE HYPO 22X1.5 SAFETY MO (MISCELLANEOUS) ×1 IMPLANT
NS IRRIG 500ML POUR BTL (IV SOLUTION) ×1 IMPLANT
PACK BASIN MINOR ARMC (MISCELLANEOUS) ×1 IMPLANT
SOLUTION PREP PVP 2OZ (MISCELLANEOUS) ×1 IMPLANT
SUCTION TUBE FRAZIER 12FR DISP (SUCTIONS) IMPLANT
SUT VIC AB 2-0 SH 27XBRD (SUTURE) IMPLANT
SUT VIC AB 3-0 SH 27X BRD (SUTURE) ×1 IMPLANT
SUTURE MNCRL 4-0 27XMF (SUTURE) ×1 IMPLANT
SYR 30ML LL (SYRINGE) ×1 IMPLANT
SYR BULB IRRIG 60ML STRL (SYRINGE) ×1 IMPLANT
TOWEL OR 17X26 4PK STRL BLUE (TOWEL DISPOSABLE) ×1 IMPLANT
TRAP FLUID SMOKE EVACUATOR (MISCELLANEOUS) ×1 IMPLANT
WATER STERILE IRR 500ML POUR (IV SOLUTION) ×1 IMPLANT

## 2024-01-11 NOTE — Anesthesia Procedure Notes (Signed)
 Procedure Name: Intubation Date/Time: 01/11/2024 9:10 AM  Performed by: Marisue Side, CRNAPre-anesthesia Checklist: Patient identified, Patient being monitored, Timeout performed, Emergency Drugs available and Suction available Patient Re-evaluated:Patient Re-evaluated prior to induction Oxygen Delivery Method: Circle system utilized Preoxygenation: Pre-oxygenation with 100% oxygen Induction Type: IV induction Ventilation: Mask ventilation without difficulty Laryngoscope Size: 3 and McGrath Grade View: Grade I Tube type: Oral Tube size: 7.5 mm Number of attempts: 1 Airway Equipment and Method: Stylet Placement Confirmation: ETT inserted through vocal cords under direct vision, positive ETCO2 and breath sounds checked- equal and bilateral Secured at: 22 cm Tube secured with: Tape Dental Injury: Teeth and Oropharynx as per pre-operative assessment

## 2024-01-11 NOTE — Discharge Instructions (Signed)
 Removal, Care After This sheet gives you information about how to care for yourself after your procedure. Your health care provider may also give you more specific instructions. If you have problems or questions, contact your health care provider. What can I expect after the procedure? After the procedure, it is common to have: Soreness. Bruising. Itching. Follow these instructions at home: site care Follow instructions from your health care provider about how to take care of your site. Make sure you: Wash your hands with soap and water before and after you change your bandage (dressing). If soap and water are not available, use hand sanitizer. Leave stitches (sutures), skin glue, or adhesive strips in place. These skin closures may need to stay in place for 2 weeks or longer. If adhesive strip edges start to loosen and curl up, you may trim the loose edges. Do not remove adhesive strips completely unless your health care provider tells you to do that. If the area bleeds or bruises, apply gentle pressure for 10 minutes. OK TO SHOWER IN 24HRS  Check your site every day for signs of infection. Check for: Redness, swelling, or pain. Fluid or blood. Warmth. Pus or a bad smell.  General instructions Rest and then return to your normal activities as told by your health care provider.  tylenol and advil as needed for discomfort.  Please alternate between the two every four hours as needed for pain.    Use narcotics, if prescribed, only when tylenol and motrin is not enough to control pain.  325-650mg  every 8hrs to max of 3000mg /24hrs (including the 325mg  in every norco dose) for the tylenol.    Advil up to 800mg  per dose every 8hrs as needed for pain.   Keep all follow-up visits as told by your health care provider. This is important. Contact a health care provider if: You have redness, swelling, or pain around your site. You have fluid or blood coming from your site. Your site feels warm to  the touch. You have pus or a bad smell coming from your site. You have a fever. Your sutures, skin glue, or adhesive strips loosen or come off sooner than expected. Get help right away if: You have bleeding that does not stop with pressure or a dressing. Summary After the procedure, it is common to have some soreness, bruising, and itching at the site. Follow instructions from your health care provider about how to take care of your site. Check your site every day for signs of infection. Contact a health care provider if you have redness, swelling, or pain around your site, or your site feels warm to the touch. Keep all follow-up visits as told by your health care provider. This is important. This information is not intended to replace advice given to you by your health care provider. Make sure you discuss any questions you have with your health care provider. Document Released: 09/11/2015 Document Revised: 02/12/2018 Document Reviewed: 02/12/2018 Elsevier Interactive Patient Education  [employer].

## 2024-01-11 NOTE — Interval H&P Note (Signed)
 No change. OK to proceed.

## 2024-01-11 NOTE — Anesthesia Preprocedure Evaluation (Signed)
 Anesthesia Evaluation  Patient identified by MRN, date of birth, ID band Patient awake  General Assessment Comment:  Patient was told "they had trouble with my breathing after gallbladder surgery" over a decade ago. Patient notes she was similarly obese at that time.  Reviewed: Allergy & Precautions, NPO status , Patient's Chart, lab work & pertinent test results  History of Anesthesia Complications Negative for: history of anesthetic complications  Airway Mallampati: III  TM Distance: <3 FB Neck ROM: Full    Dental no notable dental hx. (+) Teeth Intact   Pulmonary neg shortness of breath, sleep apnea , neg COPD, neg recent URI, Patient abstained from smoking.Not current smoker Likely undiagnosed OSA - her partner notes she snores and stops breathing at night. I educated patient of importance of getting it worked up with her PCP.   Pulmonary exam normal breath sounds clear to auscultation       Cardiovascular Exercise Tolerance: Good METS(-) hypertension(-) angina (-) CAD and (-) Past MI negative cardio ROS (-) dysrhythmias  Rhythm:Regular Rate:Normal - Systolic murmurs    Neuro/Psych  PSYCHIATRIC DISORDERS Anxiety Depression    negative neurological ROS     GI/Hepatic ,GERD  Controlled,,(+)     (-) substance abuse    Endo/Other  neg diabetesHypothyroidism  Class 3 obesity  Renal/GU negative Renal ROS     Musculoskeletal   Abdominal  (+) + obese  Peds  Hematology  (+) Blood dyscrasia, anemia   Anesthesia Other Findings Past Medical History: No date: Allergy No date: Anxiety No date: Arrhythmia No date: Depression No date: GERD (gastroesophageal reflux disease) No date: Thyroid disease     Comment:  hypo  Reproductive/Obstetrics                             Anesthesia Physical Anesthesia Plan  ASA: 3  Anesthesia Plan: General   Post-op Pain Management: Minimal or no pain  anticipated   Induction: Intravenous  PONV Risk Score and Plan: 3 and Ondansetron, Dexamethasone, Midazolam and Treatment may vary due to age or medical condition  Airway Management Planned: Oral ETT  Additional Equipment: None  Intra-op Plan:   Post-operative Plan: Extubation in OR  Informed Consent: I have reviewed the patients History and Physical, chart, labs and discussed the procedure including the risks, benefits and alternatives for the proposed anesthesia with the patient or authorized representative who has indicated his/her understanding and acceptance.     Dental advisory given  Plan Discussed with: CRNA and Surgeon  Anesthesia Plan Comments: (Discussed risks of anesthesia with patient, including possibility of difficulty with spontaneous ventilation under anesthesia necessitating airway intervention, PONV, and rare risks such as cardiac or respiratory or neurological events, and allergic reactions. Discussed the role of CRNA in patient's perioperative care. Patient understands. Patient informed about increased incidence of above perioperative risk due to high BMI. Patient understands.  )       Anesthesia Quick Evaluation

## 2024-01-11 NOTE — Transfer of Care (Signed)
 Immediate Anesthesia Transfer of Care Note  Patient: Linda Bell  Procedure(s) Performed: EXCISION, LESION, SCALP (Bilateral: Scalp)  Patient Location: PACU  Anesthesia Type:General  Level of Consciousness: drowsy and patient cooperative  Airway & Oxygen Therapy: Patient Spontanous Breathing and Patient connected to face mask oxygen  Post-op Assessment: Report given to RN and Post -op Vital signs reviewed and stable  Post vital signs: Reviewed and stable  Last Vitals:  Vitals Value Taken Time  BP 142/71 01/11/24 0958  Temp 36.6 C 01/11/24 0958  Pulse 79 01/11/24 0959  Resp 14 01/11/24 0959  SpO2 100 % 01/11/24 0959  Vitals shown include unfiled device data.  Last Pain:  Vitals:   01/11/24 0803  TempSrc: Temporal         Complications: No notable events documented.

## 2024-01-11 NOTE — Op Note (Signed)
 Pre-Op Dx: Adonica Ala cyst x 2 Post-Op Dx: Same Anesthesia: General EBL: 20 mL Complications:  none apparent Specimen: Pilar cyst x 2 Procedure: excisional biopsy of pilar cyst x 2 Surgeon: Rosea Conch  Indications for procedure: See H&P  Description of Procedure:  Consent obtained, time out performed.  Patient placed in prone position.  Area sterilized and draped in usual position.  Local infused to areas previously marked.  4 cm incision made through dermis with 15blade and pilar cyst noted in subcutaneous layer.  The  2.5 cm x 2 cm x 2.3 cm cyst then removed from surrounding tissue completely using electrocautery, passed off field pending pathology.  Wound hemostasis noted, then closed with running 4-0 monocryl in subcuticular fashion for epidermal layer.   6 cm incision made through dermis with 15blade and pilar cyst noted in subcutaneous layer.  The  2.5 cm x 3 cm x 2.4 cm cyst then removed from surrounding tissue completely using electrocautery, passed off field pending pathology.  Wound hemostasis noted, then closed with running 4-0 monocryl in subcuticular fashion for epidermal layer.     Wounds then dressed with dermabond.  Pt tolerated procedure well, and transferred to PACU in stable condition. Sponge and instrument count correct at end of procedure.

## 2024-01-12 ENCOUNTER — Encounter: Payer: Self-pay | Admitting: Surgery

## 2024-01-12 LAB — SURGICAL PATHOLOGY

## 2024-01-17 ENCOUNTER — Ambulatory Visit: Admitting: Internal Medicine

## 2024-01-17 ENCOUNTER — Encounter: Payer: Self-pay | Admitting: Internal Medicine

## 2024-01-17 VITALS — BP 118/80 | HR 75 | Temp 97.7°F | Ht 68.0 in | Wt 316.8 lb

## 2024-01-17 DIAGNOSIS — R5381 Other malaise: Secondary | ICD-10-CM

## 2024-01-17 DIAGNOSIS — E669 Obesity, unspecified: Secondary | ICD-10-CM

## 2024-01-17 DIAGNOSIS — Z6841 Body Mass Index (BMI) 40.0 and over, adult: Secondary | ICD-10-CM

## 2024-01-17 DIAGNOSIS — G4733 Obstructive sleep apnea (adult) (pediatric): Secondary | ICD-10-CM | POA: Diagnosis not present

## 2024-01-17 DIAGNOSIS — J4520 Mild intermittent asthma, uncomplicated: Secondary | ICD-10-CM

## 2024-01-17 NOTE — Progress Notes (Signed)
 Name: Linda Bell MRN: [id number] DOB: 1976-07-19    CHIEF COMPLAINT:  Follow-up assessment for OSA Follow-up assessment for asthma     HISTORY OF PRESENT ILLNESS: Regarding OSA Home sleep study performed January 2025 shows AHI of 19 Diagnosis of moderate sleep apnea  Patient uses and benefits from therapy Using CPAP nightly and with naps Pressure setting is comfortable and is sleeping well. Auto CPAP 5-20 AHI well-controlled 0.8 Excellent compliance report Reviewed in detail with patient  Assessment of OSA Responded well to albuterol  inhaler Patient is not on any maintenance therapy No exacerbation at this time No evidence of heart failure at this time No evidence or signs of infection at this time No respiratory distress No fevers, chills, nausea, vomiting, diarrhea No evidence of lower extremity edema No evidence hemoptysis    Patient is morbidly obese initial start weight 333 pounds Recommend weight loss current weight 328 Primary care physician assessing weight loss therapy  PAST MEDICAL HISTORY :   has a past medical history of Allergy, Anxiety, Arrhythmia (2009), Asthma, Depression, GERD (gastroesophageal reflux disease), Hypothyroidism, IBS (irritable bowel syndrome) (12/02/2016), Iron deficiency anemia (06/13/2023), Sleep apnea, and Thyroid disease.  has a past surgical history that includes Cholecystectomy (2009); Colonoscopy with propofol  (N/A, 06/13/2023); Esophagogastroduodenoscopy (egd) with propofol  (N/A, 06/13/2023); biopsy (06/13/2023); polypectomy (06/13/2023); Hemostasis clip placement (06/13/2023); and Lesion excision (Bilateral, 01/11/2024). Prior to Admission medications   Medication Sig Start Date End Date Taking? Authorizing Provider  albuterol  (PROVENTIL  HFA;VENTOLIN  HFA) 108 (90 Base) MCG/ACT inhaler Inhale 1-2 puffs into the lungs every 6 (six) hours as needed for wheezing or shortness of breath. 03/20/17   Brenda Calkin, NP   cholestyramine  (QUESTRAN ) 4 g packet Take 1 packet (4 g total) by mouth daily. 07/14/23 07/08/24  Brigitte Canard, PA-C  dicyclomine  (BENTYL ) 20 MG tablet Take 1 tablet (20 mg total) by mouth 3 (three) times daily as needed for spasms (for Abdominal Cramping). 07/14/23 07/08/24  Brigitte Canard, PA-C  FLUoxetine  (PROZAC ) 20 MG capsule Take 60 mg by mouth daily. 05/30/23   [provider]  hydrOXYzine  (ATARAX /VISTARIL ) 25 MG tablet Take 0.5-1 tablets (12.5-25 mg total) by mouth every 8 (eight) hours as needed for anxiety. NEED APPOINTMENT 07/18/18   Brenda Calkin, NP  ibuprofen (ADVIL) 100 MG tablet Take by mouth. 03/13/14   [provider]  levothyroxine  (SYNTHROID , LEVOTHROID) 50 MCG tablet TAKE 1 TABLET (50 MCG TOTAL) BY MOUTH DAILY. 11/18/17   Brenda Calkin, NP  mirtazapine  (REMERON ) 7.5 MG tablet Take 1 tablet (7.5 mg total) by mouth at bedtime. 06/28/23   Eappen, Saramma, MD  omeprazole (PRILOSEC) 40 MG capsule Take 20 mg by mouth once.    [provider]  Relugolix-Estradiol-Norethind (MYFEMBREE ) 40-1-0.5 MG TABS Take 1 tablet by mouth daily. 07/31/23   Sofia Dunn, MD   No Known Allergies  FAMILY HISTORY:  family history includes Breast cancer in her maternal aunt; Cancer in her father and mother; Depression in her father, maternal grandfather, mother, and paternal grandmother; Drug abuse in her father; Heart attack in her maternal grandmother and paternal grandfather; Mental illness in her brother, cousin, father, and maternal grandfather; Prostate cancer in her maternal grandfather; Schizophrenia in her maternal grandfather. SOCIAL HISTORY:  reports that she has never smoked. She has never used smokeless tobacco. She reports that she does not drink alcohol and does not use drugs.  BP 118/80 (BP Location: Left Arm, Patient Position: Sitting, Cuff Size: Large)   Pulse 75  Temp 97.7 F (36.5 C) (Oral)   Ht 5\' 8"  (1.727 m)   Wt (!) 316 lb 12.8 oz  (143.7 kg)   LMP 12/18/2023 (Approximate)   SpO2 96%   BMI 48.17 kg/m       Review of Systems: Gen:  Denies  fever, sweats, chills weight loss  HEENT: Denies blurred vision, double vision, ear pain, eye pain, hearing loss, nose bleeds, sore throat Cardiac:  No dizziness, chest pain or heaviness, chest tightness,edema, No JVD Resp:   No cough, -sputum production, -shortness of breath,-wheezing, -hemoptysis,  Other:  All other systems negative   Physical Examination:   General Appearance: No distress  EYES PERRLA, EOM intact.   NECK Supple, No JVD Pulmonary: normal breath sounds, No wheezing.  CardiovascularNormal S1,S2.  No m/r/g.   Abdomen: Benign, Soft, non-tender. Neurology UE/LE 5/5 strength, no focal deficits Ext pulses intact, cap refill intact ALL OTHER ROS ARE NEGATIVE       ASSESSMENT AND PLAN SYNOPSIS  47 year old morbidly obese female with moderate obstructive sleep apnea in the setting of morbid obesity and deconditioned state with underlying reactive airways disease consistent with asthma    Assessment of OSA Previous AHI 20 Continue CPAP as prescribed  Excellent compliance report Reviewed compliance report in detail with patient Patient definitely benefits the use of CPAP therapy as prescribed Using CPAP nightly and with naps Pressure setting is comfortable and is sleeping well. ACPAP prescription 5-20 full face mask AHI reduced to 0.8  No evidence of acute heart failure at this time No respiratory distress No fevers, chills, nausea, vomiting, diarrhea No evidence hemoptysis  Patient Instructions Continue to use CPAP every night, minimum of 4-6 hours a night.  Change equipment every 30 days or as directed by DME.  Wash your tubing with warm soap and water daily, hang to dry. Wash humidifier portion weekly. Use bottled, distilled water and change daily   Be aware of reduced alertness and do not drive or operate heavy machinery if experiencing  this or drowsiness.  Exercise encouraged, as tolerated. Encouraged proper weight management.  Important to get eight or more hours of sleep  Limiting the use of the computer and television before bedtime.  Decrease naps during the day, so night time sleep will become enhanced.  Limit caffeine, and sleep deprivation.  HTN, stroke, uncontrolled diabetes and heart failure are potential risk factors.  Risk of untreated sleep apnea including cardiac arrhthymias, stroke, DM, pulm HTN.   Assessment of asthma Mild intermittent Uses albuterol  as needed Strongly recommend weight loss   Obesity -recommend significant weight loss -recommend changing diet  Deconditioned state -Recommend increased daily activity and exercise    MEDICATION ADJUSTMENTS/LABS AND TESTS ORDERED: Continue CPAP as prescribed Albuterol  as needed Recommend weight loss Avoid Allergens and Irritants Avoid secondhand smoke Avoid SICK contacts Recommend  Masking  when appropriate Recommend Keep up-to-date with vaccinations   CURRENT MEDICATIONS REVIEWED AT LENGTH WITH PATIENT TODAY   Patient  satisfied with Plan of action and management. All questions answered   Follow up  6 months   I spent a total of 42 minutes reviewing chart data, face-to-face evaluation with the patient, counseling and coordination of care as detailed above.      Lady Pier, M.D.  [facility]  Medical Director [facility], [facility] Medical Director [facility] Cardio-Pulmonary Department

## 2024-01-17 NOTE — Patient Instructions (Addendum)
 Excellent Job A+ GOLD STAR!!  Continue CPAP as prescribed  Patient Instructions Continue to use CPAP every night, minimum of 4-6 hours a night.  Change equipment every 30 days or as directed by DME.  Wash your tubing with warm soap and water daily, hang to dry. Wash humidifier portion weekly. Use bottled, distilled water and change daily   Be aware of reduced alertness and do not drive or operate heavy machinery if experiencing this or drowsiness.  Exercise encouraged, as tolerated. Encouraged proper weight management.  Important to get eight or more hours of sleep  Limiting the use of the computer and television before bedtime.  Decrease naps during the day, so night time sleep will become enhanced.  Limit caffeine, and sleep deprivation.    Avoid Allergens and Irritants Avoid secondhand smoke Avoid SICK contacts Recommend  Masking  when appropriate Recommend Keep up-to-date with vaccinations Use albuterol  as needed

## 2024-01-28 NOTE — Anesthesia Postprocedure Evaluation (Signed)
 Anesthesia Post Note  Patient: Linda Bell  Procedure(s) Performed: EXCISION, LESION, SCALP (Bilateral: Scalp)  Patient location during evaluation: PACU Anesthesia Type: General Level of consciousness: awake and alert Pain management: pain level controlled Vital Signs Assessment: post-procedure vital signs reviewed and stable Respiratory status: spontaneous breathing, nonlabored ventilation, respiratory function stable and patient connected to nasal cannula oxygen Cardiovascular status: blood pressure returned to baseline and stable Postop Assessment: no apparent nausea or vomiting Anesthetic complications: no   No notable events documented.   Last Vitals:  Vitals:   01/11/24 1030 01/11/24 1044  BP: 120/78 115/70  Pulse: 64 63  Resp: 11 15  Temp: (!) 36.3 C (!) 36.2 C  SpO2: 100% 100%    Last Pain:  Vitals:   01/11/24 1044  TempSrc:   PainSc: 0-No pain                 Vanice Genre

## 2024-02-26 NOTE — Progress Notes (Unsigned)
    GYNECOLOGY PROGRESS NOTE  Subjective:  PCP: Orlando Dwayne NOVAK, FNP  Patient ID: Linda Bell, female    DOB: 1976-04-30, 48 y.o.   MRN: [id number]  HPI  Patient is a 48 y.o. G33P1001 female who presents for 6 month follow up for myfembree   {Common ambulatory SmartLinks:19316}  Review of Systems. {ros; complete:30496}   Objective:   There were no vitals taken for this visit. There is no height or weight on file to calculate BMI.  General appearance: {general exam:16600} Abdomen: {abdominal exam:16834} Pelvic: {pelvic exam:16852::cervix normal in appearance,external genitalia normal,no adnexal masses or tenderness,no cervical motion tenderness,rectovaginal septum normal,uterus normal size, shape, and consistency,vagina normal without discharge} Extremities: {extremity exam:5109} Neurologic: {neuro exam:17854}   Assessment/Plan:   No diagnosis found.   There are no diagnoses linked to this encounter.     Estil Mangle, DO [facility]

## 2024-02-28 ENCOUNTER — Encounter: Payer: Self-pay | Admitting: Obstetrics

## 2024-02-28 ENCOUNTER — Ambulatory Visit: Admitting: Obstetrics

## 2024-02-28 ENCOUNTER — Other Ambulatory Visit: Payer: Self-pay | Admitting: Obstetrics

## 2024-02-28 VITALS — BP 109/46 | HR 75 | Ht 68.0 in | Wt 311.1 lb

## 2024-02-28 DIAGNOSIS — Z79899 Other long term (current) drug therapy: Secondary | ICD-10-CM

## 2024-02-28 DIAGNOSIS — N924 Excessive bleeding in the premenopausal period: Secondary | ICD-10-CM

## 2024-02-28 DIAGNOSIS — N939 Abnormal uterine and vaginal bleeding, unspecified: Secondary | ICD-10-CM | POA: Diagnosis not present

## 2024-02-28 MED ORDER — MEDROXYPROGESTERONE ACETATE 10 MG PO TABS: 10.0000 mg | ORAL_TABLET | Freq: Every day | ORAL | 1 refills | Status: DC

## 2024-02-29 ENCOUNTER — Ambulatory Visit: Payer: Self-pay | Admitting: Obstetrics

## 2024-02-29 LAB — COMPREHENSIVE METABOLIC PANEL WITH GFR
ALT: 18 IU/L (ref 0–32)
AST: 21 IU/L (ref 0–40)
Albumin: 3.8 g/dL — ABNORMAL LOW (ref 3.9–4.9)
Alkaline Phosphatase: 88 IU/L (ref 44–121)
BUN/Creatinine Ratio: 10 (ref 9–23)
BUN: 9 mg/dL (ref 6–24)
Bilirubin Total: 0.5 mg/dL (ref 0.0–1.2)
CO2: 22 mmol/L (ref 20–29)
Calcium: 8.8 mg/dL (ref 8.7–10.2)
Chloride: 105 mmol/L (ref 96–106)
Creatinine, Ser: 0.9 mg/dL (ref 0.57–1.00)
Globulin, Total: 2.7 g/dL (ref 1.5–4.5)
Glucose: 103 mg/dL — ABNORMAL HIGH (ref 70–99)
Potassium: 4.8 mmol/L (ref 3.5–5.2)
Sodium: 139 mmol/L (ref 134–144)
Total Protein: 6.5 g/dL (ref 6.0–8.5)
eGFR: 79 mL/min/{1.73_m2} (ref >59)

## 2024-08-12 ENCOUNTER — Other Ambulatory Visit: Payer: Self-pay | Admitting: Obstetrics

## 2024-08-12 DIAGNOSIS — N939 Abnormal uterine and vaginal bleeding, unspecified: Secondary | ICD-10-CM

## 2024-08-14 ENCOUNTER — Telehealth: Payer: Self-pay

## 2024-08-14 NOTE — Telephone Encounter (Signed)
 Linda Bell called triage she didn't realize she didn't have any refills on her Myfembree . She is scheduled for 09/24/2024 to see Dr. Leigh. Dr. Leigh do you approve for us  to refill the the prescription.

## 2024-09-18 NOTE — Progress Notes (Signed)
" ° ° °  GYNECOLOGY PROGRESS NOTE  Subjective:  PCP: Orlando Dwayne NOVAK, FNP  Patient ID: Linda Bell, female    DOB: 1975/10/14, 49 y.o.   MRN: [id number]  HPI  Patient is a 49 y.o. G24P1001 female with suspected endometriosis who presents for MyFembree  6 month follow up.  Myfembree  started 07/2023. Patient was last seen 02/27/25 at which time Provera  was added to help with breakthrough bleeding.  She feels the medications are helping with her bleeding and she would like to continue, however she hasn't taken any of the MyFembree  in 2 weeks because she was out of refills.  She says her breasts are very painful since stopping.   The following portions of the patient's history were reviewed and updated as appropriate: allergies, current medications, past family history, past medical history, past social history, past surgical history, and problem list.  Review of Systems Pertinent items are noted in HPI.   Objective:   Blood pressure 122/78, pulse 63, weight (!) 306 lb (138.8 kg). Body mass index is 46.53 kg/m.  General appearance: alert, cooperative, and morbidly obese Abdomen: soft, non-tender; bowel sounds normal; no masses,  no organomegaly Pelvic: deferred Extremities: extremities normal, atraumatic, no cyanosis or edema Neurologic: Grossly normal   Assessment/Plan:   1. Abnormal uterine bleeding   2. Excessive bleeding in premenopausal period   3. Menorrhagia with irregular cycle   4. Medication management    49 y.o. G1P1001 with suspected endometriosis, improved on MyFembree  (started 07/2023) and breakthrough bleeding ceased with Provera . Has been out of Myfembree  for the past 2 weeks. Restart as soon as able. Will continue current medications, both refilled today. CMP today.  Follow up 6 mos for annual/pap and medication, sooner prn.    Estil Mangle, DO [facility] "

## 2024-09-24 ENCOUNTER — Ambulatory Visit: Admitting: Obstetrics

## 2024-09-24 ENCOUNTER — Encounter: Payer: Self-pay | Admitting: Obstetrics

## 2024-09-24 VITALS — BP 122/78 | HR 63 | Wt 306.0 lb

## 2024-09-24 DIAGNOSIS — N921 Excessive and frequent menstruation with irregular cycle: Secondary | ICD-10-CM | POA: Diagnosis not present

## 2024-09-24 DIAGNOSIS — N939 Abnormal uterine and vaginal bleeding, unspecified: Secondary | ICD-10-CM | POA: Diagnosis not present

## 2024-09-24 DIAGNOSIS — Z79899 Other long term (current) drug therapy: Secondary | ICD-10-CM

## 2024-09-24 DIAGNOSIS — N924 Excessive bleeding in the premenopausal period: Secondary | ICD-10-CM

## 2024-09-24 MED ORDER — MEDROXYPROGESTERONE ACETATE 10 MG PO TABS: 10.0000 mg | ORAL_TABLET | Freq: Every day | ORAL | 3 refills | Status: AC

## 2024-09-24 MED ORDER — MYFEMBREE 40-1-0.5 MG PO TABS: 1.0000 | ORAL_TABLET | Freq: Every day | ORAL | 12 refills | Status: AC

## 2024-09-25 ENCOUNTER — Ambulatory Visit: Payer: Self-pay | Admitting: Obstetrics

## 2024-09-25 LAB — COMPREHENSIVE METABOLIC PANEL WITH GFR
ALT: 17 [IU]/L (ref 0–32)
AST: 18 [IU]/L (ref 0–40)
Albumin: 4.1 g/dL (ref 3.9–4.9)
Alkaline Phosphatase: 88 [IU]/L (ref 41–116)
BUN/Creatinine Ratio: 13 (ref 9–23)
BUN: 13 mg/dL (ref 6–24)
Bilirubin Total: 0.7 mg/dL (ref 0.0–1.2)
CO2: 23 mmol/L (ref 20–29)
Calcium: 9.1 mg/dL (ref 8.7–10.2)
Chloride: 102 mmol/L (ref 96–106)
Creatinine, Ser: 1.04 mg/dL — ABNORMAL HIGH (ref 0.57–1.00)
Globulin, Total: 3 g/dL (ref 1.5–4.5)
Glucose: 81 mg/dL (ref 70–99)
Potassium: 4.6 mmol/L (ref 3.5–5.2)
Sodium: 138 mmol/L (ref 134–144)
Total Protein: 7.1 g/dL (ref 6.0–8.5)
eGFR: 66 mL/min/{1.73_m2}

## 2024-11-19 ENCOUNTER — Ambulatory Visit: Admitting: Internal Medicine
